# Patient Record
Sex: Female | Born: 2000 | Race: White | Hispanic: No | Marital: Single | Smoking: Never smoker
Health system: Southern US, Community
[De-identification: ages and names within clinical notes are randomized; demographics above are authoritative.]

## PROBLEM LIST (undated history)

## (undated) DIAGNOSIS — G5600 Carpal tunnel syndrome, unspecified upper limb: Secondary | ICD-10-CM

## (undated) DIAGNOSIS — K219 Gastro-esophageal reflux disease without esophagitis: Secondary | ICD-10-CM

## (undated) DIAGNOSIS — L509 Urticaria, unspecified: Secondary | ICD-10-CM

## (undated) DIAGNOSIS — J4520 Mild intermittent asthma, uncomplicated: Secondary | ICD-10-CM

## (undated) DIAGNOSIS — F3281 Premenstrual dysphoric disorder: Secondary | ICD-10-CM

## (undated) DIAGNOSIS — F32A Depression, unspecified: Secondary | ICD-10-CM

## (undated) DIAGNOSIS — F4310 Post-traumatic stress disorder, unspecified: Secondary | ICD-10-CM

## (undated) DIAGNOSIS — F329 Major depressive disorder, single episode, unspecified: Secondary | ICD-10-CM

## (undated) DIAGNOSIS — F909 Attention-deficit hyperactivity disorder, unspecified type: Secondary | ICD-10-CM

## (undated) DIAGNOSIS — F419 Anxiety disorder, unspecified: Secondary | ICD-10-CM

## (undated) HISTORY — PX: UPPER GI ENDOSCOPY: SHX6162

## (undated) HISTORY — DX: Urticaria, unspecified: L50.9

## (undated) HISTORY — DX: Attention-deficit hyperactivity disorder, unspecified type: F90.9

## (undated) HISTORY — DX: Mild intermittent asthma, uncomplicated: J45.20

## (undated) HISTORY — DX: Gastro-esophageal reflux disease without esophagitis: K21.9

## (undated) HISTORY — PX: COLONOSCOPY: SHX174

---
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
## 2019-01-09 ENCOUNTER — Other Ambulatory Visit: Payer: Self-pay | Admitting: Family Medicine

## 2019-01-12 NOTE — Telephone Encounter (Signed)
Message routed to PCP CMA  

## 2019-01-13 NOTE — Telephone Encounter (Signed)
Sent to the pharmacy by e-scribe. 

## 2019-01-28 ENCOUNTER — Ambulatory Visit (INDEPENDENT_AMBULATORY_CARE_PROVIDER_SITE_OTHER): Payer: Self-pay | Admitting: Family Medicine

## 2019-01-28 ENCOUNTER — Encounter: Payer: Self-pay | Admitting: Family Medicine

## 2019-01-28 ENCOUNTER — Other Ambulatory Visit: Payer: Self-pay

## 2019-01-28 VITALS — BP 118/72 | HR 108 | Temp 98.0°F | Wt 207.8 lb

## 2019-01-28 DIAGNOSIS — G43909 Migraine, unspecified, not intractable, without status migrainosus: Secondary | ICD-10-CM | POA: Insufficient documentation

## 2019-01-28 DIAGNOSIS — M546 Pain in thoracic spine: Secondary | ICD-10-CM

## 2019-01-28 DIAGNOSIS — G8929 Other chronic pain: Secondary | ICD-10-CM

## 2019-01-28 DIAGNOSIS — G43009 Migraine without aura, not intractable, without status migrainosus: Secondary | ICD-10-CM

## 2019-01-28 MED ORDER — SUMATRIPTAN SUCCINATE 100 MG PO TABS: 100.0000 mg | ORAL_TABLET | ORAL | 0 refills | Status: DC | PRN

## 2019-01-28 MED ORDER — CYCLOBENZAPRINE HCL 10 MG PO TABS: 10.0000 mg | ORAL_TABLET | Freq: Three times a day (TID) | ORAL | 0 refills | Status: DC | PRN

## 2019-01-28 NOTE — Progress Notes (Signed)
   Subjective:    Patient ID: Robin Hoover, female    DOB: 2000/06/09, 18 y.o.   MRN: [id number]  HPI Here for several issues. First she has migraine headaches, and she describes these as classic throbbing headaches that make her sensitive to light and that make her nauseated. Currently she just takes Tylenol and lies down when she has them. These have become more frequent, occurring about every other day in the last  3 weeks. Her father also has migraines. She also describes frequent tightness and pain in the neck and upper back between the shoulder blades. Heat helps. 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   Review of Systems  Constitutional: Negative.   Respiratory: Negative.   Cardiovascular: Negative.   Musculoskeletal: Positive for back pain.  Neurological: Positive for headaches.       Objective:   Physical Exam Constitutional:      General: She is not in acute distress.    Appearance: Normal appearance.  Neck:     Musculoskeletal: Normal range of motion. No neck rigidity.  Cardiovascular:     Rate and Rhythm: Normal rate and regular rhythm.     Pulses: Normal pulses.     Heart sounds: Normal heart sounds.  Pulmonary:     Effort: Pulmonary effort is normal.     Breath sounds: Normal breath sounds.  Musculoskeletal:     Comments: The neck and upper spine are normal with no tenderness and full ROM   Neurological:     General: No focal deficit present.     Mental Status: She is alert and oriented to person, place, and time.           Assessment & Plan:  For the migraines, she will try Imitrex as needed. For the back spasms, she will try Flexeril as needed. I suggested she try yoga to increase her core strength and to reduce muscular tension.  Alysia Penna, MD

## 2019-02-19 ENCOUNTER — Other Ambulatory Visit: Payer: Self-pay | Admitting: Family Medicine

## 2019-02-19 MED ORDER — CETIRIZINE HCL 10 MG PO TABS: 10.0000 mg | ORAL_TABLET | Freq: Every day | ORAL | 0 refills | Status: DC

## 2019-02-19 NOTE — Telephone Encounter (Signed)
Sent to the pharmacy by e-scribe. 

## 2019-03-07 ENCOUNTER — Other Ambulatory Visit: Payer: Self-pay | Admitting: Family Medicine

## 2019-03-09 NOTE — Telephone Encounter (Signed)
Ok for refill? 

## 2019-03-09 NOTE — Telephone Encounter (Signed)
Message routed to PCP CMA  

## 2019-03-10 NOTE — Telephone Encounter (Signed)
Will need follow up. This is not a medication I will prescribe long term for her

## 2019-04-08 ENCOUNTER — Other Ambulatory Visit: Payer: Self-pay

## 2019-04-08 ENCOUNTER — Encounter: Payer: Self-pay | Admitting: Adult Health

## 2019-04-08 ENCOUNTER — Ambulatory Visit (INDEPENDENT_AMBULATORY_CARE_PROVIDER_SITE_OTHER): Payer: BC Managed Care – PPO | Admitting: Adult Health

## 2019-04-08 VITALS — BP 140/80 | Temp 98.1°F | Wt 217.0 lb

## 2019-04-08 DIAGNOSIS — M25532 Pain in left wrist: Secondary | ICD-10-CM | POA: Diagnosis not present

## 2019-04-08 NOTE — Progress Notes (Signed)
Subjective:    Patient ID: Robin Hoover, female    DOB: 2000/12/14, 18 y.o.   MRN: [id number]  HPI 18 year old female who  has a past medical history of ADHD (attention deficit hyperactivity disorder), Anxiety, Asthma, mild intermittent, Depression, and GERD (gastroesophageal reflux disease) (05/20/2013).  She presents to the office today for for an acute issue.  She reports approximately a week ago she was at the dog park and was bit on the lateral aspect of left wrist by another dog.  There was no broken skin noted.  He continues to have discomfort to the outside of her left wrist.  She has not noticed any bruising, loss of range of motion, or grip strength.   Review of Systems See HPI   Past Medical History:  Diagnosis Date  . ADHD (attention deficit hyperactivity disorder)   . Anxiety   . Asthma, mild intermittent   . Depression   . GERD (gastroesophageal reflux disease) 05/20/2013    Social History   Socioeconomic History  . Marital status: Single    Spouse name: Not on file  . Number of children: Not on file  . Years of education: Not on file  . Highest education level: Not on file  Occupational History  . Not on file  Tobacco Use  . Smoking status: Never Smoker  . Smokeless tobacco: Never Used  Substance and Sexual Activity  . Alcohol use: No  . Drug use: No  . Sexual activity: Never  Other Topics Concern  . Not on file  Social History Narrative   She likes to sleep, watch tv   Social Determinants of Health   Financial Resource Strain:   . Difficulty of Paying Living Expenses: Not on file  Food Insecurity:   . Worried About Charity fundraiser in the Last Year: Not on file  . Ran Out of Food in the Last Year: Not on file  Transportation Needs:   . Lack of Transportation (Medical): Not on file  . Lack of Transportation (Non-Medical): Not on file  Physical Activity:   . Days of Exercise per Week: Not on file  . Minutes of Exercise per Session:  Not on file  Stress:   . Feeling of Stress : Not on file  Social Connections:   . Frequency of Communication with Friends and Family: Not on file  . Frequency of Social Gatherings with Friends and Family: Not on file  . Attends Religious Services: Not on file  . Active Member of Clubs or Organizations: Not on file  . Attends Archivist Meetings: Not on file  . Marital Status: Not on file  Intimate Partner Violence:   . Fear of Current or Ex-Partner: Not on file  . Emotionally Abused: Not on file  . Physically Abused: Not on file  . Sexually Abused: Not on file    Past Surgical History:  Procedure Laterality Date  . COLONOSCOPY      Family History  Problem Relation Age of Onset  . Bipolar disorder Father   . Diabetes Mother   . Bipolar disorder Sister   . Bipolar disorder Paternal Aunt     Allergies  Allergen Reactions  . Pollen Extract Anaphylaxis  . Dust Mite Mixed Allergen Ext [Mite (D. Farinae)]   . Molds & Smuts     Current Outpatient Medications on File Prior to Visit  Medication Sig Dispense Refill  . albuterol (VENTOLIN HFA) 108 (90 Base) MCG/ACT inhaler Inhale 1-2  puffs into the lungs as directed. 6.7 g 2  . buPROPion (WELLBUTRIN XL) 150 MG 24 hr tablet Take 1 tablet by mouth daily.  2  . buPROPion (WELLBUTRIN XL) 300 MG 24 hr tablet TK 1 T PO QAM FOR DEPRESSION    . busPIRone (BUSPAR) 10 MG tablet Take 10 mg by mouth as needed.    . cetirizine (ZYRTEC) 10 MG tablet Take 1 tablet (10 mg total) by mouth daily. 90 tablet 0  . CONCERTA 36 MG CR tablet TK 1 T PO QAM    . cyclobenzaprine (FLEXERIL) 10 MG tablet Take 1 tablet (10 mg total) by mouth 3 (three) times daily as needed for muscle spasms. 90 tablet 0  . LO LOESTRIN FE 1 MG-10 MCG / 10 MCG tablet TAKE 1 TABLET BY MOUTH DAILY 84 tablet 3  . nitrofurantoin, macrocrystal-monohydrate, (MACROBID) 100 MG capsule Take 1 capsule (100 mg total) by mouth 2 (two) times daily. 10 capsule 0  . omeprazole  (PRILOSEC) 40 MG capsule TAKE 1 CAPSULE(40 MG) BY MOUTH DAILY 90 capsule 1  . SUMAtriptan (IMITREX) 100 MG tablet Take 1 tablet (100 mg total) by mouth as needed for migraine. May repeat in 2 hours if headache persists or recurs. 10 tablet 0  . venlafaxine XR (EFFEXOR-XR) 150 MG 24 hr capsule Take 1 capsule by mouth daily.  2   No current facility-administered medications on file prior to visit.    BP 140/80   Temp 98.1 F (36.7 C)   Wt 217 lb (98.4 kg)   BMI 35.56 kg/m       Objective:   Physical Exam Vitals and nursing note reviewed.  Constitutional:      Appearance: Normal appearance.  Musculoskeletal:        General: Tenderness (Mild tenderness noted with palpation to ulnar head) present. No swelling, deformity or signs of injury.     Comments: No bruising, loss of range of motion, or decreased grip strength noted in left hand or wrist  Skin:    General: Skin is warm and dry.     Capillary Refill: Capillary refill takes less than 2 seconds.  Neurological:     General: No focal deficit present.     Mental Status: She is alert and oriented to person, place, and time. Mental status is at baseline.  Psychiatric:        Mood and Affect: Mood normal.        Behavior: Behavior normal.        Thought Content: Thought content normal.        Judgment: Judgment normal.       Assessment & Plan:  1. Acute pain of left wrist -Vies Motrin and ice.  Need for antibiotics as there is no puncture wounds noted.  She is up-to-date on tetanus.  No concern for fracture or dislocation.  Shirline Frees, NP

## 2019-04-30 ENCOUNTER — Other Ambulatory Visit: Payer: Self-pay | Admitting: Adult Health

## 2019-07-10 ENCOUNTER — Other Ambulatory Visit: Payer: Self-pay | Admitting: Adult Health

## 2019-08-20 ENCOUNTER — Other Ambulatory Visit: Payer: Self-pay | Admitting: Family Medicine

## 2019-09-24 ENCOUNTER — Other Ambulatory Visit: Payer: Self-pay

## 2019-09-25 ENCOUNTER — Ambulatory Visit (INDEPENDENT_AMBULATORY_CARE_PROVIDER_SITE_OTHER): Payer: BC Managed Care – PPO | Admitting: Adult Health

## 2019-09-25 ENCOUNTER — Encounter: Payer: Self-pay | Admitting: Adult Health

## 2019-09-25 VITALS — BP 130/70 | Temp 98.1°F | Wt 212.0 lb

## 2019-09-25 DIAGNOSIS — R5383 Other fatigue: Secondary | ICD-10-CM | POA: Diagnosis not present

## 2019-09-25 DIAGNOSIS — E559 Vitamin D deficiency, unspecified: Secondary | ICD-10-CM | POA: Diagnosis not present

## 2019-09-25 DIAGNOSIS — R4189 Other symptoms and signs involving cognitive functions and awareness: Secondary | ICD-10-CM

## 2019-09-25 LAB — CBC WITH DIFFERENTIAL/PLATELET
Basophils Absolute: 0 10*3/uL (ref 0.0–0.1)
Basophils Relative: 0.4 % (ref 0.0–3.0)
Eosinophils Absolute: 0.1 10*3/uL (ref 0.0–0.7)
Eosinophils Relative: 1.3 % (ref 0.0–5.0)
HCT: 39.6 % (ref 36.0–46.0)
Hemoglobin: 13 g/dL (ref 12.0–15.0)
Lymphocytes Relative: 29.3 % (ref 12.0–46.0)
Lymphs Abs: 2.9 10*3/uL (ref 0.7–4.0)
MCHC: 32.9 g/dL (ref 30.0–36.0)
MCV: 77.1 fl — ABNORMAL LOW (ref 78.0–100.0)
Monocytes Absolute: 0.8 10*3/uL (ref 0.1–1.0)
Monocytes Relative: 7.7 % (ref 3.0–12.0)
Neutro Abs: 6 10*3/uL (ref 1.4–7.7)
Neutrophils Relative %: 61.3 % (ref 43.0–77.0)
Platelets: 378 10*3/uL (ref 150.0–400.0)
RBC: 5.13 Mil/uL — ABNORMAL HIGH (ref 3.87–5.11)
RDW: 14.9 % — ABNORMAL HIGH (ref 11.5–14.6)
WBC: 9.8 10*3/uL (ref 4.5–10.5)

## 2019-09-25 LAB — IBC + FERRITIN
Ferritin: 14.5 ng/mL (ref 10.0–291.0)
Iron: 87 ug/dL (ref 42–145)
Saturation Ratios: 21 % (ref 20.0–50.0)
Transferrin: 296 mg/dL (ref 212.0–360.0)

## 2019-09-25 LAB — TSH: TSH: 1.91 u[IU]/mL (ref 0.35–5.50)

## 2019-09-25 LAB — VITAMIN B12: Vitamin B-12: 217 pg/mL (ref 211–911)

## 2019-09-25 LAB — VITAMIN D 25 HYDROXY (VIT D DEFICIENCY, FRACTURES): VITD: 26.8 ng/mL — ABNORMAL LOW (ref 30.00–100.00)

## 2019-09-25 NOTE — Progress Notes (Signed)
Subjective:    Patient ID: Robin Hoover, female    DOB: 2000/11/10, 19 y.o.   MRN: [id number]  HPI  19 year old female who  has a past medical history of ADHD (attention deficit hyperactivity disorder), Anxiety, Asthma, mild intermittent, Depression, and GERD (gastroesophageal reflux disease) (05/20/2013).  She presents to the office today for the complaint of memory loss and sleep disturbance.  Symptoms have been going on for quite a while she feels as though over the last 1 to 2 months been getting worse.  He reports that she always feels tired.  She goes to bed at 9 or 10:00 in the morning and wakes up between 6-8 am to go to work.  She feels tired when she wakes up and throughout the day. No trouble falling asleep or staying asleep.   She reports that she has nearly fallen asleep multiple times while driving.  She does snore but denies apneic episodes.  She does not eat healthy and does not exercise.  Additionally she feels as though her memory is poor.  Per patient report she often forgets simple things such as when she was making this appointment she cannot remember why she was making it.  She does not get lost while driving but will miss a turn and forget where she is driving to.  She is followed by psychiatry but denies any changes in her medications.  She denies fevers, chills, or abdominal pain.   Review of Systems See HPI   Past Medical History:  Diagnosis Date   ADHD (attention deficit hyperactivity disorder)    Anxiety    Asthma, mild intermittent    Depression    GERD (gastroesophageal reflux disease) 05/20/2013    Social History   Socioeconomic History   Marital status: Single    Spouse name: Not on file   Number of children: Not on file   Years of education: Not on file   Highest education level: Not on file  Occupational History   Not on file  Tobacco Use   Smoking status: Never Smoker   Smokeless tobacco: Never Used  Substance and  Sexual Activity   Alcohol use: No   Drug use: No   Sexual activity: Never  Other Topics Concern   Not on file  Social History Narrative   She likes to sleep, watch tv   Social Determinants of Health   Financial Resource Strain:    Difficulty of Paying Living Expenses:   Food Insecurity:    Worried About Programme researcher, broadcasting/film/video in the Last Year:    Barista in the Last Year:   Transportation Needs:    Freight forwarder (Medical):    Lack of Transportation (Non-Medical):   Physical Activity:    Days of Exercise per Week:    Minutes of Exercise per Session:   Stress:    Feeling of Stress :   Social Connections:    Frequency of Communication with Friends and Family:    Frequency of Social Gatherings with Friends and Family:    Attends Religious Services:    Active Member of Clubs or Organizations:    Attends Banker Meetings:    Marital Status:   Intimate Partner Violence:    Fear of Current or Ex-Partner:    Emotionally Abused:    Physically Abused:    Sexually Abused:     Past Surgical History:  Procedure Laterality Date   COLONOSCOPY  Family History  Problem Relation Age of Onset   Bipolar disorder Father    Diabetes Mother    Bipolar disorder Sister    Bipolar disorder Paternal Aunt     Allergies  Allergen Reactions   Pollen Extract Anaphylaxis   Dust Mite Mixed Allergen Ext [Mite (D. Farinae)]    Molds & Smuts     Current Outpatient Medications on File Prior to Visit  Medication Sig Dispense Refill   albuterol (VENTOLIN HFA) 108 (90 Base) MCG/ACT inhaler Inhale 1-2 puffs into the lungs as directed. 6.7 g 2   buPROPion (WELLBUTRIN XL) 300 MG 24 hr tablet TK 1 T PO QAM FOR DEPRESSION     busPIRone (BUSPAR) 10 MG tablet Take 10 mg by mouth as needed.     cetirizine (ZYRTEC) 10 MG tablet TAKE 1 TABLET BY MOUTH EVERY DAY 90 tablet 0   cyclobenzaprine (FLEXERIL) 10 MG tablet Take 1 tablet (10 mg  total) by mouth 3 (three) times daily as needed for muscle spasms. 90 tablet 0   LO LOESTRIN FE 1 MG-10 MCG / 10 MCG tablet TAKE 1 TABLET BY MOUTH DAILY 84 tablet 0   omeprazole (PRILOSEC) 40 MG capsule TAKE 1 CAPSULE(40 MG) BY MOUTH DAILY 90 capsule 0   SUMAtriptan (IMITREX) 100 MG tablet Take 1 tablet (100 mg total) by mouth as needed for migraine. May repeat in 2 hours if headache persists or recurs. 10 tablet 0   venlafaxine XR (EFFEXOR-XR) 150 MG 24 hr capsule Take 1 capsule by mouth daily.  2   No current facility-administered medications on file prior to visit.    BP 130/70    Temp 98.1 F (36.7 C)    Wt 212 lb (96.2 kg)    BMI 34.74 kg/m       Objective:   Physical Exam Vitals and nursing note reviewed.  Constitutional:      Appearance: Normal appearance. She is obese.  Cardiovascular:     Rate and Rhythm: Normal rate and regular rhythm.     Pulses: Normal pulses.     Heart sounds: Normal heart sounds.  Pulmonary:     Effort: Pulmonary effort is normal.     Breath sounds: Normal breath sounds.  Abdominal:     General: Abdomen is flat. Bowel sounds are normal.     Palpations: Abdomen is soft.  Musculoskeletal:        General: Normal range of motion.  Skin:    General: Skin is warm and dry.  Neurological:     General: No focal deficit present.     Mental Status: She is alert and oriented to person, place, and time.  Psychiatric:        Mood and Affect: Mood normal.        Behavior: Behavior normal.        Thought Content: Thought content normal.        Judgment: Judgment normal.       Assessment & Plan:  1. Fatigue, unspecified type We will check labs for common symptoms.  Can consider referral to pulmonary for sleep study.  Believe her fatigue is associated with her cognitive impairment so we may need to do imaging and/or referred to neurology as well.  We will wait for labs to come back and then make decisions - Vitamin B12 - CBC with  Differential/Platelet - TSH - IBC + Ferritin - Vitamin D, 25-hydroxy  2. Cognitive impairment  - Vitamin B12 - CBC with Differential/Platelet -  TSH - IBC + Ferritin - Vitamin D, 25-hydroxy   Shirline Frees, NP

## 2019-09-25 NOTE — Patient Instructions (Signed)
I am going to check some blood work to look for common conditions of your symptoms.   Once the blood work is back then we will call you   We may need to refer you to a neurologist and/or for a sleep study

## 2019-09-29 ENCOUNTER — Other Ambulatory Visit: Payer: Self-pay | Admitting: Family Medicine

## 2019-09-29 ENCOUNTER — Telehealth: Payer: Self-pay | Admitting: Adult Health

## 2019-09-29 DIAGNOSIS — G4710 Hypersomnia, unspecified: Secondary | ICD-10-CM

## 2019-09-29 NOTE — Telephone Encounter (Signed)
Robin Hoover pt called back to get her lab results

## 2019-09-29 NOTE — Telephone Encounter (Signed)
See result note from 09/25/19.

## 2019-10-24 ENCOUNTER — Other Ambulatory Visit: Payer: Self-pay | Admitting: Adult Health

## 2019-10-27 NOTE — Telephone Encounter (Signed)
DENIED.  NEEDS TO BE SCHEDULED FOR CPX.

## 2019-11-03 ENCOUNTER — Other Ambulatory Visit: Payer: Self-pay

## 2019-11-03 ENCOUNTER — Ambulatory Visit (INDEPENDENT_AMBULATORY_CARE_PROVIDER_SITE_OTHER): Payer: BC Managed Care – PPO | Admitting: Adult Health

## 2019-11-03 ENCOUNTER — Encounter: Payer: Self-pay | Admitting: Adult Health

## 2019-11-03 VITALS — BP 114/70 | Temp 98.4°F | Wt 216.0 lb

## 2019-11-03 DIAGNOSIS — W57XXXA Bitten or stung by nonvenomous insect and other nonvenomous arthropods, initial encounter: Secondary | ICD-10-CM | POA: Diagnosis not present

## 2019-11-03 DIAGNOSIS — S30861A Insect bite (nonvenomous) of abdominal wall, initial encounter: Secondary | ICD-10-CM

## 2019-11-03 DIAGNOSIS — G5603 Carpal tunnel syndrome, bilateral upper limbs: Secondary | ICD-10-CM

## 2019-11-03 MED ORDER — PREDNISONE 20 MG PO TABS: 20.0000 mg | ORAL_TABLET | Freq: Every day | ORAL | 0 refills | Status: DC

## 2019-11-03 MED ORDER — CETIRIZINE HCL 10 MG PO TABS: 10.0000 mg | ORAL_TABLET | Freq: Every day | ORAL | 1 refills | Status: DC

## 2019-11-03 NOTE — Progress Notes (Signed)
   Subjective:    Patient ID: Robin Hoover, female    DOB: 10/25/2000, 19 y.o.   MRN: [id number]  HPI  19 year old female who  has a past medical history of ADHD (attention deficit hyperactivity disorder), Anxiety, Asthma, mild intermittent, Depression, and GERD (gastroesophageal reflux disease) (05/20/2013).  She is being evaluated today for 2 separate issues.  First issue is that of a tick bite on her abdomen that occurred 3 days ago.  She reports that the tick was attached for about 14 hours and it was easily removed.  Reports that she has a small area of redness with itching.  Has not not had any other symptoms of tickborne illness.  Second issue has been more chronic for an unknown amount of time.  Reports numbness and tingling in bilateral hands but is worse in her dominant right hand.  Symptoms do not seem to be worse at night or in the early morning.  Has some mild decreased grip strength.  Better with rest and worse wit activities.  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  At home she has been wearing wrist splints intermittently has not found any help with this   Review of Systems     Objective:   Physical Exam Vitals and nursing note reviewed.  Constitutional:      Appearance: Normal appearance.  Cardiovascular:     Rate and Rhythm: Normal rate and regular rhythm.     Pulses: Normal pulses.     Heart sounds: Normal heart sounds.  Pulmonary:     Effort: Pulmonary effort is normal.     Breath sounds: Normal breath sounds.  Musculoskeletal:        General: No swelling or tenderness. Normal range of motion.  Skin:    Findings: Erythema present.     Comments: Quarter sized area of localized erythema noted on lower abdomen.  No bull's-eye sign.  Tick has been removed in its entirety.  Neurological:     General: No focal deficit present.     Mental Status: She is alert and oriented to person, place, and time.     Comments: Negative  Phalen's test but positive Tinel's on the right.  Psychiatric:        Mood and Affect: Mood normal.        Behavior: Behavior normal.        Thought Content: Thought content normal.        Judgment: Judgment normal.       Assessment & Plan:  1. Carpal tunnel syndrome on both sides - Follow up if no improvement after treatment  - predniSONE (DELTASONE) 20 MG tablet; Take 1 tablet (20 mg total) by mouth daily with breakfast.  Dispense: 14 tablet; Refill: 0  2. Insect bite (nonvenomous) of abdominal wall, initial encounter -Tick was attached less than 36 hours.  No signs of tickborne illness.  Advise apical cortisone cream for symptom relief.  Red flags reviewed and follow-up instructions discussed  Shirline Frees, NP

## 2019-11-06 ENCOUNTER — Other Ambulatory Visit: Payer: Self-pay

## 2019-11-06 ENCOUNTER — Encounter (HOSPITAL_COMMUNITY): Payer: Self-pay

## 2019-11-06 ENCOUNTER — Emergency Department (HOSPITAL_COMMUNITY)
Admission: EM | Admit: 2019-11-06 | Discharge: 2019-11-06 | Disposition: A | Discharge status: Home / Self Care | Payer: BC Managed Care – PPO | Attending: Emergency Medicine | Admitting: Emergency Medicine

## 2019-11-06 DIAGNOSIS — Z5321 Procedure and treatment not carried out due to patient leaving prior to being seen by health care provider: Secondary | ICD-10-CM | POA: Diagnosis not present

## 2019-11-06 DIAGNOSIS — R040 Epistaxis: Secondary | ICD-10-CM | POA: Diagnosis present

## 2019-11-06 HISTORY — DX: Carpal tunnel syndrome, unspecified upper limb: G56.00

## 2019-11-06 HISTORY — DX: Post-traumatic stress disorder, unspecified: F43.10

## 2019-11-06 NOTE — ED Triage Notes (Signed)
Patienat c/o nosebleed. Patient is currently holding pressure and currently not bleeding. Patient states it was a lot earlier just prior to coming to the ED.

## 2019-11-06 NOTE — ED Notes (Signed)
Pt stated to triage nurse that since nose stopped bleeding she wanted to go home. Triage nurse instructed pt to return if she desires further evaluation.

## 2019-11-17 ENCOUNTER — Other Ambulatory Visit: Payer: Self-pay

## 2019-11-17 ENCOUNTER — Encounter: Payer: Self-pay | Admitting: Pulmonary Disease

## 2019-11-17 ENCOUNTER — Ambulatory Visit: Payer: BC Managed Care – PPO | Admitting: Pulmonary Disease

## 2019-11-17 VITALS — BP 118/78 | HR 96 | Temp 98.0°F | Ht 66.0 in | Wt 231.0 lb

## 2019-11-17 DIAGNOSIS — G4733 Obstructive sleep apnea (adult) (pediatric): Secondary | ICD-10-CM

## 2019-11-17 NOTE — Patient Instructions (Signed)
Moderate probability of significant obstructive sleep apnea  We will schedule you for home sleep study We will update your results as soon as reviewed  Treatment options as we discussed  I will follow-up with you in about 3 months  Call with significant concerns Sleep Apnea Sleep apnea is a condition in which breathing pauses or becomes shallow during sleep. Episodes of sleep apnea usually last 10 seconds or longer, and they may occur as many as 20 times an hour. Sleep apnea disrupts your sleep and keeps your body from getting the rest that it needs. This condition can increase your risk of certain health problems, including:  Heart attack.  Stroke.  Obesity.  Diabetes.  Heart failure.  Irregular heartbeat. What are the causes? There are three kinds of sleep apnea:  Obstructive sleep apnea. This kind is caused by a blocked or collapsed airway.  Central sleep apnea. This kind happens when the part of the brain that controls breathing does not send the correct signals to the muscles that control breathing.  Mixed sleep apnea. This is a combination of obstructive and central sleep apnea. The most common cause of this condition is a collapsed or blocked airway. An airway can collapse or become blocked if:  Your throat muscles are abnormally relaxed.  Your tongue and tonsils are larger than normal.  You are overweight.  Your airway is smaller than normal. What increases the risk? You are more likely to develop this condition if you:  Are overweight.  Smoke.  Have a smaller than normal airway.  Are elderly.  Are female.  Drink alcohol.  Take sedatives or tranquilizers.  Have a family history of sleep apnea. What are the signs or symptoms? Symptoms of this condition include:  Trouble staying asleep.  Daytime sleepiness and tiredness.  Irritability.  Loud snoring.  Morning headaches.  Trouble concentrating.  Forgetfulness.  Decreased interest in  sex.  Unexplained sleepiness.  Mood swings.  Personality changes.  Feelings of depression.  Waking up often during the night to urinate.  Dry mouth.  Sore throat. How is this diagnosed? This condition may be diagnosed with:  A medical history.  A physical exam.  A series of tests that are done while you are sleeping (sleep study). These tests are usually done in a sleep lab, but they may also be done at home. How is this treated? Treatment for this condition aims to restore normal breathing and to ease symptoms during sleep. It may involve managing health issues that can affect breathing, such as high blood pressure or obesity. Treatment may include:  Sleeping on your side.  Using a decongestant if you have nasal congestion.  Avoiding the use of depressants, including alcohol, sedatives, and narcotics.  Losing weight if you are overweight.  Making changes to your diet.  Quitting smoking.  Using a device to open your airway while you sleep, such as: ? An oral appliance. This is a custom-made mouthpiece that shifts your lower jaw forward. ? A continuous positive airway pressure (CPAP) device. This device blows air through a mask when you breathe out (exhale). ? A nasal expiratory positive airway pressure (EPAP) device. This device has valves that you put into each nostril. ? A bi-level positive airway pressure (BPAP) device. This device blows air through a mask when you breathe in (inhale) and breathe out (exhale).  Having surgery if other treatments do not work. During surgery, excess tissue is removed to create a wider airway. It is important to get  treatment for sleep apnea. Without treatment, this condition can lead to:  High blood pressure.  Coronary artery disease.  In men, an inability to achieve or maintain an erection (impotence).  Reduced thinking abilities. Follow these instructions at home: Lifestyle  Make any lifestyle changes that your health care  provider recommends.  Eat a healthy, well-balanced diet.  Take steps to lose weight if you are overweight.  Avoid using depressants, including alcohol, sedatives, and narcotics.  Do not use any products that contain nicotine or tobacco, such as cigarettes, e-cigarettes, and chewing tobacco. If you need help quitting, ask your health care provider. General instructions  Take over-the-counter and prescription medicines only as told by your health care provider.  If you were given a device to open your airway while you sleep, use it only as told by your health care provider.  If you are having surgery, make sure to tell your health care provider you have sleep apnea. You may need to bring your device with you.  Keep all follow-up visits as told by your health care provider. This is important. Contact a health care provider if:  The device that you received to open your airway during sleep is uncomfortable or does not seem to be working.  Your symptoms do not improve.  Your symptoms get worse. Get help right away if:  You develop: ? Chest pain. ? Shortness of breath. ? Discomfort in your back, arms, or stomach.  You have: ? Trouble speaking. ? Weakness on one side of your body. ? Drooping in your face. These symptoms may represent a serious problem that is an emergency. Do not wait to see if the symptoms will go away. Get medical help right away. Call your local emergency services (911 in the [place] Do not drive yourself to the hospital. Summary  Sleep apnea is a condition in which breathing pauses or becomes shallow during sleep.  The most common cause is a collapsed or blocked airway.  The goal of treatment is to restore normal breathing and to ease symptoms during sleep. This information is not intended to replace advice given to you by your health care provider. Make sure you discuss any questions you have with your health care provider. Document Revised: 09/24/2018  Document Reviewed: 12/03/2017 Elsevier Patient Education  [place].

## 2019-11-17 NOTE — Progress Notes (Signed)
Robin Hoover    [id number]    Apr 06, 2001  Primary Care Physician:Nafziger, Kandee Keen, NP  Referring Physician: Shirline Frees, NP [address] [place],  [place] [address]  Chief complaint:   Patient with a history of snoring, nonrestorative sleep  HPI:  History of snoring, nonrestorative sleep Usually goes to bed between 8 PM and 10 PM About 30 minutes 1 hour to fall asleep About 0-3 awakenings Usual wake up time about 10 AM  Has gained about 10 to 15 pounds History of depression which is better controlled at present with medication changes  Sleep is relatively nonrestorative Denies any significant dryness of her mouth in the mornings No significant headaches in the morning  No caffeinated beverages during the day  Dad has obstructive sleep apnea  Does not play any sports  Never smoker  Outpatient Encounter Medications as of 11/17/2019  Medication Sig  . albuterol (VENTOLIN HFA) 108 (90 Base) MCG/ACT inhaler Inhale 1-2 puffs into the lungs as directed.  . busPIRone (BUSPAR) 10 MG tablet Take 10 mg by mouth as needed.  . cetirizine (ZYRTEC) 10 MG tablet Take 1 tablet (10 mg total) by mouth daily.  Marland Kitchen omeprazole (PRILOSEC) 40 MG capsule TAKE 1 CAPSULE(40 MG) BY MOUTH DAILY  . predniSONE (DELTASONE) 20 MG tablet Take 1 tablet (20 mg total) by mouth daily with breakfast.  . SUMAtriptan (IMITREX) 100 MG tablet Take 1 tablet (100 mg total) by mouth as needed for migraine. May repeat in 2 hours if headache persists or recurs.  . venlafaxine XR (EFFEXOR-XR) 150 MG 24 hr capsule Take 1 capsule by mouth daily.  Marland Kitchen buPROPion (WELLBUTRIN XL) 300 MG 24 hr tablet TK 1 T PO QAM FOR DEPRESSION  . cyclobenzaprine (FLEXERIL) 10 MG tablet Take 1 tablet (10 mg total) by mouth 3 (three) times daily as needed for muscle spasms. (Patient not taking: Reported on 11/17/2019)  . LO LOESTRIN FE 1 MG-10 MCG / 10 MCG tablet TAKE 1 TABLET BY MOUTH DAILY (Patient not taking:  Reported on 11/17/2019)   No facility-administered encounter medications on file as of 11/17/2019.    Allergies as of 11/17/2019 - Review Complete 11/17/2019  Allergen Reaction Noted  . Pollen extract Anaphylaxis 05/20/2013  . Dust mite mixed allergen ext [mite (d. farinae)]  05/27/2017  . Molds & smuts  05/27/2017    Past Medical History:  Diagnosis Date  . ADHD (attention deficit hyperactivity disorder)   . Anxiety   . Asthma, mild intermittent   . Carpal tunnel syndrome   . Depression   . GERD (gastroesophageal reflux disease) 05/20/2013  . PTSD (post-traumatic stress disorder)     Past Surgical History:  Procedure Laterality Date  . COLONOSCOPY    . UPPER GI ENDOSCOPY      Family History  Problem Relation Age of Onset  . Bipolar disorder Father   . Diabetes Mother   . Bipolar disorder Sister   . Bipolar disorder Paternal Aunt     Social History   Socioeconomic History  . Marital status: Single    Spouse name: Not on file  . Number of children: Not on file  . Years of education: Not on file  . Highest education level: Not on file  Occupational History  . Not on file  Tobacco Use  . Smoking status: Never Smoker  . Smokeless tobacco: Never Used  Vaping Use  . Vaping Use: Never used  Substance and Sexual Activity  .  Alcohol use: No  . Drug use: No  . Sexual activity: Never  Other Topics Concern  . Not on file  Social History Narrative   She likes to sleep, watch tv   Social Determinants of Health   Financial Resource Strain:   . Difficulty of Paying Living Expenses:   Food Insecurity:   . Worried About Programme researcher, broadcasting/film/video in the Last Year:   . Barista in the Last Year:   Transportation Needs:   . Freight forwarder (Medical):   Marland Kitchen Lack of Transportation (Non-Medical):   Physical Activity:   . Days of Exercise per Week:   . Minutes of Exercise per Session:   Stress:   . Feeling of Stress :   Social Connections:   . Frequency of  Communication with Friends and Family:   . Frequency of Social Gatherings with Friends and Family:   . Attends Religious Services:   . Active Member of Clubs or Organizations:   . Attends Banker Meetings:   Marland Kitchen Marital Status:   Intimate Partner Violence:   . Fear of Current or Ex-Partner:   . Emotionally Abused:   Marland Kitchen Physically Abused:   . Sexually Abused:     Review of Systems  Constitutional: Positive for fatigue.  Respiratory: Positive for apnea.   Psychiatric/Behavioral: Positive for sleep disturbance.    There were no vitals filed for this visit.   Physical Exam Constitutional:      Appearance: She is obese.  HENT:     Head: Normocephalic.     Mouth/Throat:     Mouth: Mucous membranes are moist.     Comments: Mallampati 3, crowded oropharynx Eyes:     General:        Right eye: No discharge.        Left eye: No discharge.  Cardiovascular:     Rate and Rhythm: Normal rate and regular rhythm.     Pulses: Normal pulses.     Heart sounds: Normal heart sounds. No murmur heard.  No friction rub.  Pulmonary:     Effort: Pulmonary effort is normal. No respiratory distress.     Breath sounds: Normal breath sounds. No stridor. No wheezing or rhonchi.  Musculoskeletal:     Cervical back: No rigidity or tenderness.  Skin:    General: Skin is warm.  Neurological:     General: No focal deficit present.     Mental Status: She is alert.  Psychiatric:        Mood and Affect: Mood normal.    Results of the Epworth flowsheet 11/17/2019  Sitting and reading 1  Watching TV 1  Sitting, inactive in a public place (e.g. a theatre or a meeting) 2  As a passenger in a car for an hour without a break 3  Lying down to rest in the afternoon when circumstances permit 3  Sitting and talking to someone 1  Sitting quietly after a lunch without alcohol 3  In a car, while stopped for a few minutes in traffic 0  Total score 14    Assessment:   Excessive daytime  sleepiness  Nonrestorative sleep  Significant snoring  Moderate probability of significant obstructive sleep apnea  Pathophysiology of sleep disordered breathing discussed with patient  Treatment options for sleep disordered breathing discussed with the patient  Plan/Recommendations: Risk of not treating sleep disordered breathing discussed  We will schedule the patient for home sleep study  Weight loss efforts encouraged  Follow-up in 3 months   Virl Diamond MD [facility] 11/17/2019, 3:04 PM  CC: Shirline Frees, NP

## 2019-11-27 ENCOUNTER — Telehealth: Payer: Self-pay | Admitting: Adult Health

## 2019-11-30 MED ORDER — LO LOESTRIN FE 1 MG-10 MCG / 10 MCG PO TABS: 1.0000 | ORAL_TABLET | Freq: Every day | ORAL | 0 refills | Status: DC

## 2019-11-30 MED ORDER — OMEPRAZOLE 40 MG PO CPDR: DELAYED_RELEASE_CAPSULE | ORAL | 0 refills | Status: DC

## 2019-11-30 NOTE — Addendum Note (Signed)
Addended by: Raj Janus T on: 11/30/2019 01:56 PM   Modules accepted: Orders

## 2019-11-30 NOTE — Telephone Encounter (Signed)
Sent to the pharmacy by e-scribe. 

## 2019-11-30 NOTE — Telephone Encounter (Signed)
Pt is scheduled in September for her CPE. Pt asked for a refill on two medication until she is able to be seen. She needs it sent to a diff [facility] that is listed below  Medication Refill: Omeprazole  Lo Loestrin   Pharmacy: [id number] [place], [place] - [address] AT [facility] [address] Phone:  [phone]  Fax:  [phone]

## 2019-11-30 NOTE — Telephone Encounter (Signed)
Denied.  Due for yearly physical. 

## 2019-12-30 ENCOUNTER — Telehealth: Payer: Self-pay | Admitting: Adult Health

## 2019-12-30 ENCOUNTER — Ambulatory Visit (INDEPENDENT_AMBULATORY_CARE_PROVIDER_SITE_OTHER): Payer: BC Managed Care – PPO | Admitting: Adult Health

## 2019-12-30 ENCOUNTER — Other Ambulatory Visit: Payer: Self-pay

## 2019-12-30 ENCOUNTER — Encounter: Payer: Self-pay | Admitting: Adult Health

## 2019-12-30 VITALS — BP 118/70 | HR 97 | Temp 98.7°F | Ht 65.6 in | Wt 218.6 lb

## 2019-12-30 DIAGNOSIS — F32A Depression, unspecified: Secondary | ICD-10-CM

## 2019-12-30 DIAGNOSIS — Z3009 Encounter for other general counseling and advice on contraception: Secondary | ICD-10-CM

## 2019-12-30 DIAGNOSIS — F902 Attention-deficit hyperactivity disorder, combined type: Secondary | ICD-10-CM

## 2019-12-30 DIAGNOSIS — Z0000 Encounter for general adult medical examination without abnormal findings: Secondary | ICD-10-CM

## 2019-12-30 DIAGNOSIS — E6689 Other obesity not elsewhere classified: Secondary | ICD-10-CM

## 2019-12-30 DIAGNOSIS — R300 Dysuria: Secondary | ICD-10-CM

## 2019-12-30 DIAGNOSIS — K219 Gastro-esophageal reflux disease without esophagitis: Secondary | ICD-10-CM | POA: Diagnosis not present

## 2019-12-30 DIAGNOSIS — G43009 Migraine without aura, not intractable, without status migrainosus: Secondary | ICD-10-CM

## 2019-12-30 DIAGNOSIS — F419 Anxiety disorder, unspecified: Secondary | ICD-10-CM | POA: Diagnosis not present

## 2019-12-30 DIAGNOSIS — E668 Other obesity: Secondary | ICD-10-CM

## 2019-12-30 DIAGNOSIS — F329 Major depressive disorder, single episode, unspecified: Secondary | ICD-10-CM

## 2019-12-30 MED ORDER — NORETHIN-ETH ESTRAD-FE BIPHAS 1 MG-10 MCG / 10 MCG PO TABS: 1.0000 | ORAL_TABLET | Freq: Every day | ORAL | 3 refills | Status: DC

## 2019-12-30 MED ORDER — FLUCONAZOLE 150 MG PO TABS: 150.0000 mg | ORAL_TABLET | Freq: Once | ORAL | 0 refills | Status: AC

## 2019-12-30 NOTE — Progress Notes (Signed)
Subjective:    Patient ID: Robin Hoover, female    DOB: 03-29-2001, 19 y.o.   MRN: [id number]  HPI Patient presents for yearly preventative medicine examination. She is a pleasant 19 year old female who  has a past medical history of ADHD (attention deficit hyperactivity disorder), Anxiety, Asthma, mild intermittent, Carpal tunnel syndrome, Depression, GERD (gastroesophageal reflux disease) (05/20/2013), and PTSD (post-traumatic stress disorder).  ADHD -managed by psychiatry.  Currently prescribed. Currently   Depression/anxiety-Managed  by psychiatry.  Takes Effexor, Buspar, and  Wellbutrin and feels well controlled  GERD - takes prilosec daily   Birth Control -takes low Loestrin Fe daily.   Migraines-takes Imitrex 100 mg as needed  Dysuria -she reports that 4 days ago she went to urgent care due to concern of a UTI.  She had dysuria, frequency, and urgency.  Her urinalysis showed possible infection and she was started on Bactrim.  Her culture came back negative ( I do not have these records) he subsequently stopped Bactrim.  Unfortunately, she continues to have symptoms of dysuria, frequency, no urgency.  She is not concerned for STDs at this time.  She denies vaginal discharge or itching.  All immunizations and health maintenance protocols were reviewed with the patient and needed orders were placed.  Appropriate screening laboratory values were ordered for the patient including screening of hyperlipidemia, renal function and hepatic function.  Medication reconciliation,  past medical history, social history, problem list and allergies were reviewed in detail with the patient  Goals were established with regard to weight loss, exercise, and  diet in compliance with medications. She is packing her lunch and not going out to eat any longer. She is also starting a women's kick boxing class.   Wt Readings from Last 3 Encounters:  12/30/19 218 lb 9.6 oz (99.2 kg)  11/17/19 (!)  231 lb (104.8 kg)  11/03/19 216 lb (98 kg)    Review of Systems  Constitutional: Negative.   HENT: Negative.   Eyes: Negative.   Respiratory: Negative.   Cardiovascular: Negative.   Gastrointestinal: Negative.   Endocrine: Negative.   Genitourinary: Positive for dysuria, frequency and urgency. Negative for vaginal bleeding, vaginal discharge and vaginal pain.  Musculoskeletal: Negative.   Skin: Negative.   Allergic/Immunologic: Negative.   Neurological: Negative.   Hematological: Negative.   Psychiatric/Behavioral: Negative.    Past Medical History:  Diagnosis Date  . ADHD (attention deficit hyperactivity disorder)   . Anxiety   . Asthma, mild intermittent   . Carpal tunnel syndrome   . Depression   . GERD (gastroesophageal reflux disease) 05/20/2013  . PTSD (post-traumatic stress disorder)     Social History   Socioeconomic History  . Marital status: Single    Spouse name: Not on file  . Number of children: Not on file  . Years of education: Not on file  . Highest education level: Not on file  Occupational History  . Not on file  Tobacco Use  . Smoking status: Never Smoker  . Smokeless tobacco: Never Used  Vaping Use  . Vaping Use: Never used  Substance and Sexual Activity  . Alcohol use: No  . Drug use: No  . Sexual activity: Never  Other Topics Concern  . Not on file  Social History Narrative   She likes to sleep, watch tv   Social Determinants of Health   Financial Resource Strain:   . Difficulty of Paying Living Expenses: Not on file  Food Insecurity:   .  Worried About Charity fundraiser in the Last Year: Not on file  . Ran Out of Food in the Last Year: Not on file  Transportation Needs:   . Lack of Transportation (Medical): Not on file  . Lack of Transportation (Non-Medical): Not on file  Physical Activity:   . Days of Exercise per Week: Not on file  . Minutes of Exercise per Session: Not on file  Stress:   . Feeling of Stress : Not on file    Social Connections:   . Frequency of Communication with Friends and Family: Not on file  . Frequency of Social Gatherings with Friends and Family: Not on file  . Attends Religious Services: Not on file  . Active Member of Clubs or Organizations: Not on file  . Attends Archivist Meetings: Not on file  . Marital Status: Not on file  Intimate Partner Violence:   . Fear of Current or Ex-Partner: Not on file  . Emotionally Abused: Not on file  . Physically Abused: Not on file  . Sexually Abused: Not on file    Past Surgical History:  Procedure Laterality Date  . COLONOSCOPY    . UPPER GI ENDOSCOPY      Family History  Problem Relation Age of Onset  . Bipolar disorder Father   . Diabetes Mother   . Bipolar disorder Sister   . Bipolar disorder Paternal Aunt     Allergies  Allergen Reactions  . Pollen Extract Anaphylaxis  . Dust Mite Mixed Allergen Ext [Mite (D. Farinae)]   . Molds & Smuts     Current Outpatient Medications on File Prior to Visit  Medication Sig Dispense Refill  . albuterol (VENTOLIN HFA) 108 (90 Base) MCG/ACT inhaler Inhale 1-2 puffs into the lungs as directed. 6.7 g 2  . buPROPion (WELLBUTRIN XL) 300 MG 24 hr tablet TK 1 T PO QAM FOR DEPRESSION    . busPIRone (BUSPAR) 10 MG tablet Take 10 mg by mouth as needed.    . cetirizine (ZYRTEC) 10 MG tablet Take 1 tablet (10 mg total) by mouth daily. 90 tablet 1  . omeprazole (PRILOSEC) 40 MG capsule TAKE 1 CAPSULE(40 MG) BY MOUTH DAILY 90 capsule 0  . phenazopyridine (PYRIDIUM) 100 MG tablet Take 100-200 mg by mouth 3 (three) times daily.    . sertraline (ZOLOFT) 25 MG tablet Take by mouth.    . SUMAtriptan (IMITREX) 100 MG tablet Take 1 tablet (100 mg total) by mouth as needed for migraine. May repeat in 2 hours if headache persists or recurs. 10 tablet 0  . venlafaxine XR (EFFEXOR-XR) 150 MG 24 hr capsule Take 1 capsule by mouth daily.  2   No current facility-administered medications on file prior to  visit.    BP 118/70   Pulse 97   Temp 98.7 F (37.1 C) (Oral)   Ht 5' 5.6" (1.666 m)   Wt 218 lb 9.6 oz (99.2 kg)   SpO2 98%   BMI 35.71 kg/m       Objective:   Physical Exam Vitals and nursing note reviewed.  Constitutional:      General: She is not in acute distress.    Appearance: Normal appearance. She is well-developed. She is not ill-appearing.  HENT:     Head: Normocephalic and atraumatic.     Right Ear: Tympanic membrane, ear canal and external ear normal. There is no impacted cerumen.     Left Ear: Tympanic membrane, ear canal and external ear  normal. There is no impacted cerumen.     Nose: Nose normal. No congestion or rhinorrhea.     Mouth/Throat:     Mouth: Mucous membranes are moist.     Pharynx: Oropharynx is clear. No oropharyngeal exudate or posterior oropharyngeal erythema.  Eyes:     General:        Right eye: No discharge.        Left eye: No discharge.     Extraocular Movements: Extraocular movements intact.     Conjunctiva/sclera: Conjunctivae normal.     Pupils: Pupils are equal, round, and reactive to light.  Neck:     Thyroid: No thyromegaly.     Vascular: No carotid bruit.     Trachea: No tracheal deviation.  Cardiovascular:     Rate and Rhythm: Normal rate and regular rhythm.     Pulses: Normal pulses.     Heart sounds: Normal heart sounds. No murmur heard.  No friction rub. No gallop.   Pulmonary:     Effort: Pulmonary effort is normal. No respiratory distress.     Breath sounds: Normal breath sounds. No stridor. No wheezing, rhonchi or rales.  Chest:     Chest wall: No tenderness.  Abdominal:     General: Abdomen is flat. Bowel sounds are normal. There is no distension.     Palpations: Abdomen is soft. There is no mass.     Tenderness: There is no abdominal tenderness. There is no right CVA tenderness, left CVA tenderness, guarding or rebound.     Hernia: No hernia is present.  Musculoskeletal:        General: No swelling,  tenderness, deformity or signs of injury. Normal range of motion.     Cervical back: Normal range of motion and neck supple.     Right lower leg: No edema.     Left lower leg: No edema.  Lymphadenopathy:     Cervical: No cervical adenopathy.  Skin:    General: Skin is warm and dry.     Coloration: Skin is not jaundiced or pale.     Findings: No bruising, erythema, lesion or rash.  Neurological:     General: No focal deficit present.     Mental Status: She is alert and oriented to person, place, and time.     Cranial Nerves: No cranial nerve deficit.     Sensory: No sensory deficit.     Motor: No weakness.     Coordination: Coordination normal.     Gait: Gait normal.     Deep Tendon Reflexes: Reflexes normal.  Psychiatric:        Mood and Affect: Mood normal.        Behavior: Behavior normal.        Thought Content: Thought content normal.        Judgment: Judgment normal.       Assessment & Plan:  1. Routine general medical examination at a health care facility  - CBC with Differential/Platelet; Future - Hemoglobin A1c; Future - Lipid panel; Future - TSH; Future - CMP with eGFR([facility]); Future  2. Anxiety and depression -Follow-up with psychiatry as directed  3. ADHD (attention deficit hyperactivity disorder), combined type -Follow-up with psychiatry as directed  4. Gastroesophageal reflux disease, unspecified whether esophagitis present - Continue with Prilosec   5. Birth control counseling  - POCT urine pregnancy; Future - Norethindrone-Ethinyl Estradiol-Fe Biphas (LO LOESTRIN FE) 1 MG-10 MCG / 10 MCG tablet; Take 1 tablet by mouth daily.  Dispense: 84 tablet; Refill: 3  6. Migraine without aura and without status migrainosus, not intractable - Continue with imitrex as needed - CBC with Differential/Platelet; Future - Hemoglobin A1c; Future - Lipid panel; Future - TSH; Future - CMP with eGFR([facility]); Future  7. Other obesity - Congratulated on weight loss.  Continue with lifestyle modifications  - CBC with Differential/Platelet; Future - Hemoglobin A1c; Future - Lipid panel; Future - TSH; Future - CMP with eGFR([facility]); Future  8. Dysuria  - Urinalysis; Future - Culture, Urine; Future - Urine cytology ancillary only - fluconazole (DIFLUCAN) 150 MG tablet; Take 1 tablet (150 mg total) by mouth once for 1 dose.  Dispense: 1 tablet; Refill: 0  Dorothyann Peng, NP

## 2019-12-30 NOTE — Telephone Encounter (Signed)
PAtient called to let Kandee Keen know that there is not a generic for this birth control Norethindrone-Ethinyl Estradiol-Fe Biphas (LO LOESTRIN FE) 1 MG-10 MCG / 10 MCG tablet  She wants to know if he can call her in a different birth control.  Please Advise

## 2019-12-31 NOTE — Telephone Encounter (Signed)
Can you please assist with this since PCP is out until Tuesday

## 2019-12-31 NOTE — Telephone Encounter (Signed)
There is a generic for Loestrin Fe 1/20 and I think that should be OK to substitute.

## 2020-01-01 ENCOUNTER — Other Ambulatory Visit: Payer: Self-pay | Admitting: *Deleted

## 2020-01-01 DIAGNOSIS — Z3009 Encounter for other general counseling and advice on contraception: Secondary | ICD-10-CM

## 2020-01-01 MED ORDER — NORETHIN ACE-ETH ESTRAD-FE 1-20 MG-MCG PO TABS: 1.0000 | ORAL_TABLET | Freq: Every day | ORAL | 3 refills | Status: DC

## 2020-01-01 NOTE — Telephone Encounter (Signed)
Below Rx advise from Dr Caryl Never sent to patient pharmacy

## 2020-01-06 ENCOUNTER — Ambulatory Visit: Payer: BC Managed Care – PPO

## 2020-01-06 ENCOUNTER — Other Ambulatory Visit: Payer: Self-pay

## 2020-01-06 DIAGNOSIS — G4733 Obstructive sleep apnea (adult) (pediatric): Secondary | ICD-10-CM

## 2020-01-12 ENCOUNTER — Telehealth: Payer: Self-pay | Admitting: Pulmonary Disease

## 2020-01-12 DIAGNOSIS — G4733 Obstructive sleep apnea (adult) (pediatric): Secondary | ICD-10-CM

## 2020-01-12 NOTE — Telephone Encounter (Signed)
Patient contacted with results of home sleep study. Patient is willing to start CPAP therapy. DME order placed, follow up recall in. Patient verbalized understanding of results and recommendations from Dr. Wynona Neat.

## 2020-01-12 NOTE — Telephone Encounter (Signed)
Call patient  Sleep study result  Date of study: 01/06/2020  Impression: Mild obstructive sleep apnea No significant oxygen desaturations  Recommendation: I will recommend CPAP therapy for obstructive sleep apnea in the context of excessive daytime sleepiness  Another option of treatment will be an oral device-we will require referring to a dentist to fashion an oral device  Sleep position optimization by encouraging sleeping lateral position, elevating the head of the bed may help very mild sleep apnea and snoring  Follow-up in the office as previously scheduled

## 2020-01-28 ENCOUNTER — Encounter: Payer: Self-pay | Admitting: Adult Health

## 2020-01-28 ENCOUNTER — Telehealth: Payer: Self-pay | Admitting: Adult Health

## 2020-01-28 ENCOUNTER — Telehealth (INDEPENDENT_AMBULATORY_CARE_PROVIDER_SITE_OTHER): Payer: BC Managed Care – PPO | Admitting: Adult Health

## 2020-01-28 DIAGNOSIS — F419 Anxiety disorder, unspecified: Secondary | ICD-10-CM

## 2020-01-28 MED ORDER — BUPROPION HCL ER (XL) 300 MG PO TB24: ORAL_TABLET | ORAL | 0 refills | Status: DC

## 2020-01-28 MED ORDER — VENLAFAXINE HCL ER 150 MG PO CP24: 150.0000 mg | ORAL_CAPSULE | Freq: Every day | ORAL | 0 refills | Status: DC

## 2020-01-28 NOTE — Progress Notes (Signed)
Virtual Visit via Video Note  I connected with Robin Hoover on 01/28/20 at  2:00 PM EDT by a video enabled telemedicine application and verified that I am speaking with the correct person using two identifiers.  Location patient: home Location provider:work or home office Persons participating in the virtual visit: patient, provider  I discussed the limitations of evaluation and management by telemedicine and the availability of in person appointments. The patient expressed understanding and agreed to proceed.   HPI:  -year-old female who is being evaluated today for anxiety. She has been out of Wellbutrin for approximately 1 month and her Effexor for the last 3 days. She is seen by psychiatry but has not been in 10 months so they sent we will no longer fill her medication. Since being out of her Effexor she has started to go through withdrawal symptoms and had "full on panic attack about 2 to 3 hours ago while is at work". She called and schedule an appointment for November 3 with her psychiatrist but they still will not fill the medication until she is seen. Currently feeling better but does feel as though her anxiety is "going up and down".  She denies SI or HI  ROS: See pertinent positives and negatives per HPI.  Past Medical History:  Diagnosis Date  . ADHD (attention deficit hyperactivity disorder)   . Anxiety   . Asthma, mild intermittent   . Carpal tunnel syndrome   . Depression   . GERD (gastroesophageal reflux disease) 05/20/2013  . PTSD (post-traumatic stress disorder)     Past Surgical History:  Procedure Laterality Date  . COLONOSCOPY    . UPPER GI ENDOSCOPY      Family History  Problem Relation Age of Onset  . Bipolar disorder Father   . Diabetes Mother   . Bipolar disorder Sister   . Bipolar disorder Paternal Aunt        Current Outpatient Medications:  .  albuterol (VENTOLIN HFA) 108 (90 Base) MCG/ACT inhaler, Inhale 1-2 puffs into the lungs as  directed., Disp: 6.7 g, Rfl: 2 .  buPROPion (WELLBUTRIN XL) 300 MG 24 hr tablet, TK 1 T PO QAM FOR DEPRESSION, Disp: , Rfl:  .  busPIRone (BUSPAR) 10 MG tablet, Take 10 mg by mouth as needed., Disp: , Rfl:  .  cetirizine (ZYRTEC) 10 MG tablet, Take 1 tablet (10 mg total) by mouth daily., Disp: 90 tablet, Rfl: 1 .  Norethin Ace-Eth Estrad-FE (AUROVELA FE 1/20 PO), Take by mouth., Disp: , Rfl:  .  omeprazole (PRILOSEC) 40 MG capsule, TAKE 1 CAPSULE(40 MG) BY MOUTH DAILY, Disp: 90 capsule, Rfl: 0 .  venlafaxine XR (EFFEXOR-XR) 150 MG 24 hr capsule, Take 1 capsule by mouth daily., Disp: , Rfl: 2 .  sertraline (ZOLOFT) 25 MG tablet, Take by mouth., Disp: , Rfl:  .  SUMAtriptan (IMITREX) 100 MG tablet, Take 1 tablet (100 mg total) by mouth as needed for migraine. May repeat in 2 hours if headache persists or recurs., Disp: 10 tablet, Rfl: 0  EXAM:  VITALS per patient if applicable:  GENERAL: alert, oriented, appears well and in no acute distress  HEENT: atraumatic, conjunttiva clear, no obvious abnormalities on inspection of external nose and ears  NECK: normal movements of the head and neck  LUNGS: on inspection no signs of respiratory distress, breathing rate appears normal, no obvious gross SOB, gasping or wheezing  CV: no obvious cyanosis  MS: moves all visible extremities without noticeable abnormality  PSYCH/NEURO: pleasant  and cooperative, no obvious depression or anxiety, speech and thought processing grossly intact  ASSESSMENT AND PLAN:  Discussed the following assessment and plan:  1. Anxiety -We will fill both of her medications for 30 days. She was advised that this would be the only refill I prescribed for her and she needs to follow-up with psychiatry for further evaluation. - buPROPion (WELLBUTRIN XL) 300 MG 24 hr tablet; TK 1 T PO QAM FOR DEPRESSION  Dispense: 30 tablet; Refill: 0 - venlafaxine XR (EFFEXOR-XR) 150 MG 24 hr capsule; Take 1 capsule (150 mg total) by mouth  daily.  Dispense: 30 capsule; Refill: 0     I discussed the assessment and treatment plan with the patient. The patient was provided an opportunity to ask questions and all were answered. The patient agreed with the plan and demonstrated an understanding of the instructions.   The patient was advised to call back or seek an in-person evaluation if the symptoms worsen or if the condition fails to improve as anticipated.   Shirline Frees, NP

## 2020-01-28 NOTE — Telephone Encounter (Signed)
Pt call and want Robin Hoover to know that her psychiatrist will not fill her medication because she missed her appointments haven't been seen in 10 mos and want Robin Hoover to call her.

## 2020-02-16 ENCOUNTER — Other Ambulatory Visit: Payer: Self-pay | Admitting: Adult Health

## 2020-02-16 DIAGNOSIS — F419 Anxiety disorder, unspecified: Secondary | ICD-10-CM

## 2020-03-02 ENCOUNTER — Other Ambulatory Visit: Payer: Self-pay | Admitting: Adult Health

## 2020-03-10 ENCOUNTER — Encounter: Payer: Self-pay | Admitting: Adult Health

## 2020-03-10 ENCOUNTER — Other Ambulatory Visit: Payer: Self-pay

## 2020-03-10 ENCOUNTER — Ambulatory Visit (INDEPENDENT_AMBULATORY_CARE_PROVIDER_SITE_OTHER): Payer: BC Managed Care – PPO | Admitting: Adult Health

## 2020-03-10 VITALS — BP 120/72 | HR 90 | Temp 98.4°F | Ht 64.0 in | Wt 217.0 lb

## 2020-03-10 DIAGNOSIS — Z23 Encounter for immunization: Secondary | ICD-10-CM

## 2020-03-10 DIAGNOSIS — R251 Tremor, unspecified: Secondary | ICD-10-CM | POA: Diagnosis not present

## 2020-03-10 NOTE — Progress Notes (Signed)
Subjective:    Patient ID: Robin Hoover, female    DOB: [birth year], 19 y.o.   MRN: [id number]  HPI 19 year old female who is being evaluated today for worsening tremors. She reports that she has had tremors to some degree since early childhood. 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 Her tremors are pretty constant bilaterally but sometimes worse in the right and sometimes worse than the left. She is taking all of her medications as directed and is not concerned for withdrawal symptoms. She has also noticed recently that she started to experience jerking in certain extremities from time to time.  Tremors have always affected her writing and drawing  Review of Systems See HPI   Past Medical History:  Diagnosis Date  . ADHD (attention deficit hyperactivity disorder)   . Anxiety   . Asthma, mild intermittent   . Carpal tunnel syndrome   . Depression   . GERD (gastroesophageal reflux disease) 05/20/2013  . PTSD (post-traumatic stress disorder)     Social History   Socioeconomic History  . Marital status: Single    Spouse name: Not on file  . Number of children: Not on file  . Years of education: Not on file  . Highest education level: Not on file  Occupational History  . Not on file  Tobacco Use  . Smoking status: Never Smoker  . Smokeless tobacco: Never Used  Vaping Use  . Vaping Use: Never used  Substance and Sexual Activity  . Alcohol use: No  . Drug use: No  . Sexual activity: Never  Other Topics Concern  . Not on file  Social History Narrative   She likes to sleep, watch tv   Social Determinants of Health   Financial Resource Strain:   . Difficulty of Paying Living Expenses: Not on file  Food Insecurity:   . Worried About Charity fundraiser in the Last Year: Not on file  . Ran Out of Food in the Last Year: Not on file   Transportation Needs:   . Lack of Transportation (Medical): Not on file  . Lack of Transportation (Non-Medical): Not on file  Physical Activity:   . Days of Exercise per Week: Not on file  . Minutes of Exercise per Session: Not on file  Stress:   . Feeling of Stress : Not on file  Social Connections:   . Frequency of Communication with Friends and Family: Not on file  . Frequency of Social Gatherings with Friends and Family: Not on file  . Attends Religious Services: Not on file  . Active Member of Clubs or Organizations: Not on file  . Attends Archivist Meetings: Not on file  . Marital Status: Not on file  Intimate Partner Violence:   . Fear of Current or Ex-Partner: Not on file  . Emotionally Abused: Not on file  . Physically Abused: Not on file  . Sexually Abused: Not on file    Past Surgical History:  Procedure Laterality Date  . COLONOSCOPY    . UPPER GI ENDOSCOPY      Family History  Problem Relation Age of Onset  . Bipolar disorder Father   . Diabetes Mother   . Bipolar disorder Sister   . Bipolar disorder Paternal Aunt     Allergies  Allergen Reactions  . Pollen Extract Anaphylaxis  .  Dust Mite Mixed Allergen Ext [Mite (D. Farinae)]   . Molds & Smuts     Current Outpatient Medications on File Prior to Visit  Medication Sig Dispense Refill  . albuterol (VENTOLIN HFA) 108 (90 Base) MCG/ACT inhaler Inhale 1-2 puffs into the lungs as directed. 6.7 g 2  . buPROPion (WELLBUTRIN XL) 300 MG 24 hr tablet TAKE 1 TABLET BY MOUTH EVERY MORNING FOR DEPRESSION 30 tablet 0  . busPIRone (BUSPAR) 10 MG tablet Take 10 mg by mouth as needed.    . cetirizine (ZYRTEC) 10 MG tablet TAKE 1 TABLET(10 MG) BY MOUTH DAILY 90 tablet 1  . Norethin Ace-Eth Estrad-FE (AUROVELA FE 1/20 PO) Take by mouth.    Marland Kitchen omeprazole (PRILOSEC) 40 MG capsule TAKE 1 CAPSULE(40 MG) BY MOUTH DAILY 90 capsule 0  . propranolol (INDERAL) 20 MG tablet Take 10 mg by mouth 2 (two) times daily.    .  sertraline (ZOLOFT) 25 MG tablet Take by mouth.    . SUMAtriptan (IMITREX) 100 MG tablet Take 1 tablet (100 mg total) by mouth as needed for migraine. May repeat in 2 hours if headache persists or recurs. 10 tablet 0  . venlafaxine XR (EFFEXOR-XR) 150 MG 24 hr capsule Take 1 capsule (150 mg total) by mouth daily. 30 capsule 0   No current facility-administered medications on file prior to visit.    BP 120/72 (BP Location: Left Arm, Patient Position: Sitting, Cuff Size: Large)   Pulse 90   Temp 98.4 F (36.9 C) (Oral)   Ht $R'5\' 4"'sH$  (1.626 m)   Wt 217 lb (98.4 kg)   LMP 02/08/2020 (Approximate)   BMI 37.25 kg/m       Objective:   Physical Exam Vitals and nursing note reviewed.  Constitutional:      Appearance: Normal appearance. She is obese.  Eyes:     Extraocular Movements: Extraocular movements intact.     Pupils: Pupils are equal, round, and reactive to light.     Comments: No KF ring noted    Skin:    General: Skin is warm and dry.     Capillary Refill: Capillary refill takes less than 2 seconds.  Neurological:     General: No focal deficit present.     Mental Status: She is alert and oriented to person, place, and time.     Sensory: No sensory deficit.     Motor: Tremor present. No weakness.     Comments: He has mild to moderate tremor bilaterally that is worse in her right hand.  Psychiatric:        Mood and Affect: Mood normal.        Behavior: Behavior normal.        Thought Content: Thought content normal.        Judgment: Judgment normal.       Assessment & Plan:  1. Tremors of nervous system -Need to rule out Wilson's disease, will start work-up and refer to neurology. I do not think this is medication related as there have not been any medication changes and she has had this for many years. We will hold off of MRI of the brain until blood work returned results - CMP with eGFR([facility]); Future - Ceruloplasmin; Future - TSH; Future - Magnesium; Future - Heavy  Metals Panel, Blood; Future - Direct antiglobulin test (not at [facility]); Future - Ambulatory referral to Neurology  Dorothyann Peng, NP

## 2020-03-14 LAB — COMPLETE METABOLIC PANEL WITH GFR
AG Ratio: 1.2 (calc) (ref 1.0–2.5)
ALT: 19 U/L (ref 6–29)
AST: 24 U/L (ref 10–30)
Albumin: 4.1 g/dL (ref 3.6–5.1)
Alkaline phosphatase (APISO): 91 U/L (ref 31–125)
BUN: 12 mg/dL (ref 7–25)
CO2: 26 mmol/L (ref 20–32)
Calcium: 9.5 mg/dL (ref 8.6–10.2)
Chloride: 103 mmol/L (ref 98–110)
Creat: 0.72 mg/dL (ref 0.50–1.10)
GFR, Est African American: 140 mL/min/{1.73_m2} (ref >=60)
GFR, Est Non African American: 121 mL/min/{1.73_m2} (ref >=60)
Globulin: 3.4 g/dL (calc) (ref 1.9–3.7)
Glucose, Bld: 69 mg/dL (ref 65–99)
Potassium: 4.6 mmol/L (ref 3.5–5.3)
Sodium: 138 mmol/L (ref 135–146)
Total Bilirubin: 0.4 mg/dL (ref 0.2–1.2)
Total Protein: 7.5 g/dL (ref 6.1–8.1)

## 2020-03-14 LAB — TSH: TSH: 2.79 mIU/L

## 2020-03-14 LAB — MAGNESIUM: Magnesium: 1.8 mg/dL (ref 1.5–2.5)

## 2020-03-14 LAB — HEAVY METALS PANEL, BLOOD
Arsenic: <10 mcg/L (ref <23)
Lead: <1 ug/dL (ref <5)
Mercury, B: <5 mcg/L (ref 0–10)

## 2020-03-14 LAB — CERULOPLASMIN: Ceruloplasmin: 51 mg/dL (ref 18–53)

## 2020-03-14 LAB — DIRECT ANTIGLOBULIN TEST (NOT AT ARMC): DAT, Polyspecific: NEGATIVE

## 2020-03-14 NOTE — Progress Notes (Signed)
Assessment/Plan:   1.  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  Patient and I discussed nature and pathophysiology of symptoms.  We discussed that this diagnosis does not mean that one "made up" the symptoms, but rather that the symptoms are derived from stress.  Discussed with the patient that his type of tremor is not generally effectively treated with medication.  Discussed with the patient that treatment involves counseling to help when understand the source of the stress.  Discussed that it could, and generally does, take quite some time for counseling to help resolve the symptoms.  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  She states that she likely just needs to get back with her current counselor.  She is tearful but understanding.  -recommended [email or web address] as a resource to the patient  -I did tell the patient that I cannot rule out that she has a degree of essential tremor and we talked about medication, but I told her that this would not help any tremor coming from functional tremor, which I think is the large majority of the tremor.  She declined trying medication for now.  I think that is a wise decision.  2.  F/u prn    Subjective:   Robin Hoover was seen in consultation in the movement disorder clinic at the request of Shirline Frees, NP.  The evaluation is for tremor.  Tremor started at the age of 19 years old but has become more noticeable.  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  Tremor involves the bilateral UE.  Tremor is present at rest and with activation.  She feels a jerking in the legs but no actual tremor.   There is a family hx of tremor but in mom/sister but they were told that it was PTSD related.  Father also had tremor but they thought that it was med related.  She  had lab work completed, including serum ceruloplasmin and TSH, within the last week and that was all unremarkable.  Affected by caffeine:  Does make tremor worse but she doesn't drink it any longer b/c of it Affected by alcohol:  (drinks 1 time per month so doesn't know) Affected by stress:  Yes.   Affected by fatigue:  No. Spills soup if on spoon:  No. if concentrates hard Affects ADL's (tying shoes, brushing teeth, etc):  No.  Current/Previously tried tremor medications: propranolol 10 mg bid (just put on it by psych for tremor and hasn't noticed any benefit and seemed to make asthma worse so not taking much any longer)  Current medications that may exacerbate tremor:  Albuterol (not using much because hers is expired) - when does use it tremor gets worse  Outside reports reviewed: historical medical records, lab reports, office notes and referral letter/letters.  Allergies  Allergen Reactions  . Pollen Extract Anaphylaxis  . Dust Mite Mixed Allergen Ext [Mite (D. Farinae)]   . Molds & Smuts     Current Outpatient Medications  Medication Instructions  . albuterol (VENTOLIN HFA) 108 (90 Base) MCG/ACT inhaler 1-2 puffs, Inhalation, As directed  . buPROPion (WELLBUTRIN XL) 300 MG 24 hr tablet TAKE 1 TABLET BY MOUTH EVERY MORNING FOR DEPRESSION  . busPIRone (BUSPAR) 10 mg, Oral, As needed  . cetirizine (ZYRTEC) 10 MG tablet TAKE 1 TABLET(10  MG) BY MOUTH DAILY  . Norethin Ace-Eth Estrad-FE (AUROVELA FE 1/20 PO) 1 tablet, Oral, Daily  . omeprazole (PRILOSEC) 40 MG capsule TAKE 1 CAPSULE(40 MG) BY MOUTH DAILY  . propranolol (INDERAL) 10 mg, Oral, 2 times daily  . sertraline (ZOLOFT) 25 mg, Oral, Daily  . SUMAtriptan (IMITREX) 100 mg, Oral, As needed, May repeat in 2 hours if headache persists or recurs.  . venlafaxine XR (EFFEXOR-XR) 150 mg, Oral, Daily     Objective:   VITALS:   Vitals:   03/15/20 0840  BP: 116/73  Pulse: (!) 112  SpO2: 98%  Weight: 215 lb (97.5 kg)  Height:  5\' 6"  (1.676 m)   Gen:  Appears stated age and in NAD. HEENT:  Normocephalic, atraumatic. The mucous membranes are moist. The superficial temporal arteries are without ropiness or tenderness. Cardiovascular: tachy.  regular Lungs: Clear to auscultation bilaterally. Neck: There are no carotid bruits noted bilaterally.  NEUROLOGICAL:  Orientation:  The patient is alert and oriented x 3.   Cranial nerves: There is good facial symmetry. Extraocular muscles are intact and visual fields are full to confrontational testing. Speech is fluent and clear. Soft palate rises symmetrically and there is no tongue deviation. Hearing is intact to conversational tone. Tone: Tone is good throughout. Sensation: Sensation is intact to light touch touch throughout (facial, trunk, extremities). Vibration is intact at the bilateral big toe. There is no extinction with double simultaneous stimulation. There is no sensory dermatomal level identified. Coordination:  The patient has no dysdiadichokinesia or dysmetria. Motor: Strength is 5/5 in the bilateral upper and lower extremities.  Shoulder shrug is equal bilaterally.  There is no pronator drift.  There are no fasciculations noted. DTR's: Deep tendon reflexes are 2/4 at the bilateral biceps, triceps, brachioradialis, patella and achilles.  Plantar responses are downgoing bilaterally. Gait and Station: The patient is able to ambulate without difficulty. The patient is able to heel toe walk without any difficulty. The patient is able to ambulate in a tandem fashion. The patient is able to stand in the Romberg position.   MOVEMENT EXAM: Tremor:  There is no rest tremor.  There is no intention tremor.  Postural tremor in the right knee that has variable amplitude and frequency.  There is entrainment.  She has some trouble with Archimedes spiral's, but minimally so.     Chemistry      Component Value Date/Time   NA 138 03/10/2020 1641   K 4.6 03/10/2020 1641   CL 103  03/10/2020 1641   CO2 26 03/10/2020 1641   BUN 12 03/10/2020 1641   CREATININE 0.72 03/10/2020 1641      Component Value Date/Time   CALCIUM 9.5 03/10/2020 1641   ALKPHOS 97 10/20/2017 1346   AST 24 03/10/2020 1641   ALT 19 03/10/2020 1641   BILITOT 0.4 03/10/2020 1641      Lab Results  Component Value Date   WBC 9.8 09/25/2019   HGB 13.0 09/25/2019   HCT 39.6 09/25/2019   MCV 77.1 (L) 09/25/2019   PLT 378.0 09/25/2019   Lab Results  Component Value Date   TSH 2.79 03/10/2020   Serum ceruloplasmin was normal.   Total time spent on today's visit was 45 minutes, including both face-to-face time and nonface-to-face time.  Time included that spent on review of records (prior notes available to me/labs/imaging if pertinent), discussing treatment and goals, answering patient's questions and coordinating care.  CC:  03/12/2020, NP

## 2020-03-15 ENCOUNTER — Encounter: Payer: Self-pay | Admitting: Neurology

## 2020-03-15 ENCOUNTER — Other Ambulatory Visit: Payer: Self-pay

## 2020-03-15 ENCOUNTER — Ambulatory Visit: Payer: BC Managed Care – PPO | Admitting: Neurology

## 2020-03-15 VITALS — BP 116/73 | HR 112 | Ht 66.0 in | Wt 215.0 lb

## 2020-03-15 DIAGNOSIS — R251 Tremor, unspecified: Secondary | ICD-10-CM

## 2020-03-15 NOTE — Patient Instructions (Signed)
It was good to see you today!  Let us know if you would like referrals to counselors who understand this disease.  You can try and explore the website [email or web address]  The physicians and staff at [facility] are committed to providing excellent care. You may receive a survey requesting feedback about your experience at our office. We strive to receive "very good" responses to the survey questions. If you feel that your experience would prevent you from giving the office a "very good " response, please contact our office to try to remedy the situation. We may be reached at [phone]. Thank you for taking the time out of your busy day to complete the survey.

## 2020-03-17 ENCOUNTER — Other Ambulatory Visit: Payer: Self-pay | Admitting: Adult Health

## 2020-03-17 DIAGNOSIS — F419 Anxiety disorder, unspecified: Secondary | ICD-10-CM

## 2020-03-21 ENCOUNTER — Other Ambulatory Visit: Payer: Self-pay

## 2020-03-21 DIAGNOSIS — F32A Depression, unspecified: Secondary | ICD-10-CM

## 2020-03-21 DIAGNOSIS — F4310 Post-traumatic stress disorder, unspecified: Secondary | ICD-10-CM

## 2020-03-21 DIAGNOSIS — R251 Tremor, unspecified: Secondary | ICD-10-CM

## 2020-04-05 ENCOUNTER — Other Ambulatory Visit: Payer: Self-pay | Admitting: Adult Health

## 2020-04-05 ENCOUNTER — Encounter: Payer: Self-pay | Admitting: Adult Health

## 2020-04-05 DIAGNOSIS — J069 Acute upper respiratory infection, unspecified: Secondary | ICD-10-CM

## 2020-04-05 MED ORDER — ALBUTEROL SULFATE HFA 108 (90 BASE) MCG/ACT IN AERS: 1.0000 | INHALATION_SPRAY | RESPIRATORY_TRACT | 2 refills | Status: DC

## 2020-04-08 ENCOUNTER — Telehealth: Payer: BC Managed Care – PPO | Admitting: Family Medicine

## 2020-04-25 ENCOUNTER — Ambulatory Visit: Payer: BC Managed Care – PPO | Admitting: Psychology

## 2020-04-27 ENCOUNTER — Ambulatory Visit (INDEPENDENT_AMBULATORY_CARE_PROVIDER_SITE_OTHER): Payer: BC Managed Care – PPO | Admitting: Psychology

## 2020-04-27 DIAGNOSIS — F4310 Post-traumatic stress disorder, unspecified: Secondary | ICD-10-CM | POA: Diagnosis not present

## 2020-05-11 ENCOUNTER — Ambulatory Visit (INDEPENDENT_AMBULATORY_CARE_PROVIDER_SITE_OTHER): Payer: BC Managed Care – PPO | Admitting: Psychology

## 2020-05-11 DIAGNOSIS — F330 Major depressive disorder, recurrent, mild: Secondary | ICD-10-CM | POA: Diagnosis not present

## 2020-05-11 DIAGNOSIS — F4310 Post-traumatic stress disorder, unspecified: Secondary | ICD-10-CM | POA: Diagnosis not present

## 2020-05-15 ENCOUNTER — Encounter: Payer: Self-pay | Admitting: Adult Health

## 2020-05-16 ENCOUNTER — Telehealth (INDEPENDENT_AMBULATORY_CARE_PROVIDER_SITE_OTHER): Payer: BC Managed Care – PPO | Admitting: Family Medicine

## 2020-05-16 ENCOUNTER — Encounter: Payer: Self-pay | Admitting: Family Medicine

## 2020-05-16 VITALS — Wt 215.0 lb

## 2020-05-16 DIAGNOSIS — U071 COVID-19: Secondary | ICD-10-CM | POA: Diagnosis not present

## 2020-05-16 MED ORDER — FLUTICASONE PROPIONATE 50 MCG/ACT NA SUSP: 1.0000 | Freq: Every day | NASAL | 0 refills | Status: DC

## 2020-05-16 MED ORDER — BENZONATATE 100 MG PO CAPS: 100.0000 mg | ORAL_CAPSULE | Freq: Two times a day (BID) | ORAL | 0 refills | Status: DC | PRN

## 2020-05-16 NOTE — Progress Notes (Signed)
Virtual Visit via Video Note  I connected with Robin Hoover on 05/16/20 at  2:00 PM EST by a video enabled telemedicine application 2/2 COVID-19 pandemic and verified that I am speaking with the correct person using two identifiers.  Location patient: home Location provider:work or home office Persons participating in the virtual visit: patient, provider  I discussed the limitations of evaluation and management by telemedicine and the availability of in person appointments. The patient expressed understanding and agreed to proceed.   HPI: Pt is a 20 yo female with pmh sig for asthma, migraines, GERD carpal tunnel, PTSD, MDD, GAD, ADHD who is followed by Duane Lope, NP and seen today for acute concern.  Pt tested positive for COVID yesterday, 05/15/20.  Symptoms started Friday 05/13/20 with a HA.  Pt endorses sore throat, cough, fatigue, congestion, loss of taste, ear pain and pressure.  States breathing is ok, has not had to use her albuterol inhaler.  Pt denies vomiting.  Tried dayquil, nyquil, and ibuprofen for symptoms.  Pt notes several co-workers have been out with Covid.  Pt works at [employer] in [employer].  She notes some customers refuse to wear a mask.  Pt had 2 doses of pfizer COVID vaccine.  ROS: See pertinent positives and negatives per HPI.  Past Medical History:  Diagnosis Date  . ADHD (attention deficit hyperactivity disorder)   . Anxiety   . Asthma, mild intermittent   . Carpal tunnel syndrome   . Depression   . GERD (gastroesophageal reflux disease) 05/20/2013  . PTSD (post-traumatic stress disorder)     Past Surgical History:  Procedure Laterality Date  . COLONOSCOPY    . UPPER GI ENDOSCOPY      Family History  Problem Relation Age of Onset  . Bipolar disorder Father   . Narcolepsy Father   . Diabetes Mother   . Bipolar disorder Sister   . Narcolepsy Sister   . Bipolar disorder Paternal Aunt      Current Outpatient Medications:  .   albuterol (VENTOLIN HFA) 108 (90 Base) MCG/ACT inhaler, Inhale 1-2 puffs into the lungs as directed., Disp: 6.7 g, Rfl: 2 .  buPROPion (WELLBUTRIN XL) 300 MG 24 hr tablet, TAKE 1 TABLET BY MOUTH EVERY MORNING FOR DEPRESSION, Disp: 30 tablet, Rfl: 0 .  busPIRone (BUSPAR) 10 MG tablet, Take 10 mg by mouth as needed., Disp: , Rfl:  .  cetirizine (ZYRTEC) 10 MG tablet, TAKE 1 TABLET(10 MG) BY MOUTH DAILY, Disp: 90 tablet, Rfl: 1 .  Norethin Ace-Eth Estrad-FE (AUROVELA FE 1/20 PO), Take 1 tablet by mouth daily. , Disp: , Rfl:  .  omeprazole (PRILOSEC) 40 MG capsule, TAKE 1 CAPSULE(40 MG) BY MOUTH DAILY, Disp: 90 capsule, Rfl: 0 .  propranolol (INDERAL) 20 MG tablet, Take 10 mg by mouth 2 (two) times daily., Disp: , Rfl:  .  sertraline (ZOLOFT) 25 MG tablet, Take 25 mg by mouth daily. , Disp: , Rfl:  .  SUMAtriptan (IMITREX) 100 MG tablet, Take 1 tablet (100 mg total) by mouth as needed for migraine. May repeat in 2 hours if headache persists or recurs., Disp: 10 tablet, Rfl: 0 .  venlafaxine XR (EFFEXOR-XR) 150 MG 24 hr capsule, Take 1 capsule (150 mg total) by mouth daily., Disp: 30 capsule, Rfl: 0  EXAM:  VITALS per patient if applicable: RR between 12-20 bpm  GENERAL: alert, oriented, appears well and in no acute distress  HEENT: atraumatic, conjunctiva clear, no obvious abnormalities on inspection of  external nose and ears  NECK: normal movements of the head and neck  LUNGS: on inspection no signs of respiratory distress, breathing rate appears normal, no obvious gross SOB, gasping or wheezing  CV: no obvious cyanosis  MS: moves all visible extremities without noticeable abnormality  PSYCH/NEURO: pleasant and cooperative, no obvious depression or anxiety, speech and thought processing grossly intact  ASSESSMENT AND PLAN:  Discussed the following assessment and plan:  COVID-19 virus infection  -tested positive on 05/15/20 -Discussed supportive care -Continue self  quarantine -Discussed return to work condition -Given strict precautions for worsening symptoms or shortness of breath - Plan: fluticasone (FLONASE) 50 MCG/ACT nasal spray, benzonatate (TESSALON) 100 MG capsule  Follow-up as needed  I discussed the assessment and treatment plan with the patient. The patient was provided an opportunity to ask questions and all were answered. The patient agreed with the plan and demonstrated an understanding of the instructions.   The patient was advised to call back or seek an in-person evaluation if the symptoms worsen or if the condition fails to improve as anticipated.  [name], MD

## 2020-05-16 NOTE — Telephone Encounter (Signed)
Spoke with the pt and scheduled an appt for today with Dr Salomon Fick as PCP is out of the office.

## 2020-05-17 ENCOUNTER — Encounter: Payer: Self-pay | Admitting: Adult Health

## 2020-05-18 ENCOUNTER — Other Ambulatory Visit: Payer: Self-pay | Admitting: Adult Health

## 2020-05-18 DIAGNOSIS — J069 Acute upper respiratory infection, unspecified: Secondary | ICD-10-CM

## 2020-05-19 ENCOUNTER — Ambulatory Visit (INDEPENDENT_AMBULATORY_CARE_PROVIDER_SITE_OTHER): Payer: BC Managed Care – PPO | Admitting: Psychology

## 2020-05-19 ENCOUNTER — Ambulatory Visit: Payer: BC Managed Care – PPO | Admitting: Psychology

## 2020-05-19 DIAGNOSIS — F331 Major depressive disorder, recurrent, moderate: Secondary | ICD-10-CM

## 2020-05-19 DIAGNOSIS — F4310 Post-traumatic stress disorder, unspecified: Secondary | ICD-10-CM | POA: Diagnosis not present

## 2020-05-31 ENCOUNTER — Ambulatory Visit (INDEPENDENT_AMBULATORY_CARE_PROVIDER_SITE_OTHER): Payer: BC Managed Care – PPO | Admitting: Psychology

## 2020-05-31 DIAGNOSIS — F330 Major depressive disorder, recurrent, mild: Secondary | ICD-10-CM

## 2020-05-31 DIAGNOSIS — F4310 Post-traumatic stress disorder, unspecified: Secondary | ICD-10-CM

## 2020-06-02 ENCOUNTER — Other Ambulatory Visit: Payer: Self-pay | Admitting: Adult Health

## 2020-06-02 NOTE — Telephone Encounter (Signed)
Sent to the pharmacy by e-scribe. 

## 2020-06-15 ENCOUNTER — Ambulatory Visit (INDEPENDENT_AMBULATORY_CARE_PROVIDER_SITE_OTHER): Payer: BC Managed Care – PPO | Admitting: Psychology

## 2020-06-15 DIAGNOSIS — F330 Major depressive disorder, recurrent, mild: Secondary | ICD-10-CM

## 2020-06-15 DIAGNOSIS — F4310 Post-traumatic stress disorder, unspecified: Secondary | ICD-10-CM | POA: Diagnosis not present

## 2020-06-28 ENCOUNTER — Ambulatory Visit (INDEPENDENT_AMBULATORY_CARE_PROVIDER_SITE_OTHER): Payer: BC Managed Care – PPO | Admitting: Psychology

## 2020-06-28 DIAGNOSIS — F4310 Post-traumatic stress disorder, unspecified: Secondary | ICD-10-CM

## 2020-07-05 ENCOUNTER — Ambulatory Visit (INDEPENDENT_AMBULATORY_CARE_PROVIDER_SITE_OTHER): Payer: BC Managed Care – PPO | Admitting: Psychology

## 2020-07-05 DIAGNOSIS — F4310 Post-traumatic stress disorder, unspecified: Secondary | ICD-10-CM

## 2020-07-12 ENCOUNTER — Ambulatory Visit (INDEPENDENT_AMBULATORY_CARE_PROVIDER_SITE_OTHER): Payer: BC Managed Care – PPO | Admitting: Psychology

## 2020-07-12 DIAGNOSIS — F4310 Post-traumatic stress disorder, unspecified: Secondary | ICD-10-CM

## 2020-07-19 ENCOUNTER — Ambulatory Visit (INDEPENDENT_AMBULATORY_CARE_PROVIDER_SITE_OTHER): Payer: BC Managed Care – PPO | Admitting: Psychology

## 2020-07-19 DIAGNOSIS — F4310 Post-traumatic stress disorder, unspecified: Secondary | ICD-10-CM | POA: Diagnosis not present

## 2020-07-24 ENCOUNTER — Encounter: Payer: Self-pay | Admitting: Adult Health

## 2020-07-26 ENCOUNTER — Ambulatory Visit (INDEPENDENT_AMBULATORY_CARE_PROVIDER_SITE_OTHER): Payer: BC Managed Care – PPO | Admitting: Psychology

## 2020-07-26 DIAGNOSIS — F4310 Post-traumatic stress disorder, unspecified: Secondary | ICD-10-CM

## 2020-08-01 ENCOUNTER — Other Ambulatory Visit: Payer: Self-pay

## 2020-08-02 ENCOUNTER — Ambulatory Visit (INDEPENDENT_AMBULATORY_CARE_PROVIDER_SITE_OTHER): Payer: BC Managed Care – PPO | Admitting: Adult Health

## 2020-08-02 ENCOUNTER — Encounter: Payer: Self-pay | Admitting: Adult Health

## 2020-08-02 ENCOUNTER — Ambulatory Visit (INDEPENDENT_AMBULATORY_CARE_PROVIDER_SITE_OTHER): Payer: BC Managed Care – PPO | Admitting: Psychology

## 2020-08-02 VITALS — BP 138/76 | HR 88 | Temp 98.5°F | Wt 216.0 lb

## 2020-08-02 DIAGNOSIS — L729 Follicular cyst of the skin and subcutaneous tissue, unspecified: Secondary | ICD-10-CM

## 2020-08-02 DIAGNOSIS — F4310 Post-traumatic stress disorder, unspecified: Secondary | ICD-10-CM | POA: Diagnosis not present

## 2020-08-02 NOTE — Progress Notes (Signed)
Subjective:    Patient ID: Robin Hoover, female    DOB: [birth year], 20 y.o.   MRN: [id number]  HPI 20 year old female who  has a past medical history of ADHD (attention deficit hyperactivity disorder), Anxiety, Asthma, mild intermittent, Carpal tunnel syndrome, Depression, GERD (gastroesophageal reflux disease) (05/20/2013), and PTSD (post-traumatic stress disorder).   She presents to the office today for an acute issue.  She reports that about a week ago she noticed a lump on her left upper arm.  When she first noticed that it was larger and was painful to palpation..  Over the last few days this "lump "has decreased in size and is no longer hurting.  She denies redness, warmth, or drainage.  Has no loss of sensation in her left hand.    Review of Systems See HPI   Past Medical History:  Diagnosis Date  . ADHD (attention deficit hyperactivity disorder)   . Anxiety   . Asthma, mild intermittent   . Carpal tunnel syndrome   . Depression   . GERD (gastroesophageal reflux disease) 05/20/2013  . PTSD (post-traumatic stress disorder)     Social History   Socioeconomic History  . Marital status: Single    Spouse name: Not on file  . Number of children: Not on file  . Years of education: Not on file  . Highest education level: Not on file  Occupational History  . Not on file  Tobacco Use  . Smoking status: Never Smoker  . Smokeless tobacco: Never Used  Vaping Use  . Vaping Use: Never used  Substance and Sexual Activity  . Alcohol use: Yes    Comment: rarely  . Drug use: No  . Sexual activity: Never  Other Topics Concern  . Not on file  Social History Narrative   She likes to sleep, watch tv   Social Determinants of Health   Financial Resource Strain: Not on file  Food Insecurity: Not on file  Transportation Needs: Not on file  Physical Activity: Not on file  Stress: Not on file  Social Connections: Not on file  Intimate Partner Violence: Not on file     Past Surgical History:  Procedure Laterality Date  . COLONOSCOPY    . UPPER GI ENDOSCOPY      Family History  Problem Relation Age of Onset  . Bipolar disorder Father   . Narcolepsy Father   . Diabetes Mother   . Bipolar disorder Sister   . Narcolepsy Sister   . Bipolar disorder Paternal Aunt     Allergies  Allergen Reactions  . Pollen Extract Anaphylaxis  . Dust Mite Mixed Allergen Ext [Mite (D. Farinae)]   . Molds & Smuts     Current Outpatient Medications on File Prior to Visit  Medication Sig Dispense Refill  . albuterol (VENTOLIN HFA) 108 (90 Base) MCG/ACT inhaler Inhale 1-2 puffs into the lungs as directed. 6.7 g 2  . buPROPion (WELLBUTRIN XL) 300 MG 24 hr tablet TAKE 1 TABLET BY MOUTH EVERY MORNING FOR DEPRESSION 30 tablet 0  . busPIRone (BUSPAR) 10 MG tablet Take 10 mg by mouth as needed.    . cetirizine (ZYRTEC) 10 MG tablet TAKE 1 TABLET(10 MG) BY MOUTH DAILY 90 tablet 1  . Norethin Ace-Eth Estrad-FE (AUROVELA FE 1/20 PO) Take 1 tablet by mouth daily.     Marland Kitchen omeprazole (PRILOSEC) 40 MG capsule TAKE 1 CAPSULE(40 MG) BY MOUTH DAILY 90 capsule 1  . venlafaxine XR (EFFEXOR-XR) 150 MG 24  hr capsule Take 1 capsule (150 mg total) by mouth daily. 30 capsule 0  . benzonatate (TESSALON) 100 MG capsule Take 1 capsule (100 mg total) by mouth 2 (two) times daily as needed for cough. (Patient not taking: Reported on 08/02/2020) 20 capsule 0  . fluticasone (FLONASE) 50 MCG/ACT nasal spray Place 1 spray into both nostrils daily. (Patient not taking: Reported on 08/02/2020) 16 g 0  . propranolol (INDERAL) 20 MG tablet Take 10 mg by mouth 2 (two) times daily. (Patient not taking: Reported on 08/02/2020)    . sertraline (ZOLOFT) 25 MG tablet Take 25 mg by mouth daily.  (Patient not taking: Reported on 08/02/2020)    . SUMAtriptan (IMITREX) 100 MG tablet Take 1 tablet (100 mg total) by mouth as needed for migraine. May repeat in 2 hours if headache persists or recurs. (Patient not taking:  Reported on 08/02/2020) 10 tablet 0   No current facility-administered medications on file prior to visit.    BP 138/76 (BP Location: Right Arm, Patient Position: Sitting, Cuff Size: Normal)   Pulse 88   Temp 98.5 F (36.9 C) (Oral)   Wt 216 lb (98 kg)   SpO2 98%   BMI 34.86 kg/m       Objective:   Physical Exam Vitals and nursing note reviewed.  Constitutional:      Appearance: Normal appearance.  Cardiovascular:     Pulses: Normal pulses.          Radial pulses are 2+ on the right side and 2+ on the left side.  Skin:    General: Skin is warm and dry.     Capillary Refill: Capillary refill takes less than 2 seconds.     Comments: Has about a 1 cm cystic mass in her left upper arm.  Neurological:     General: No focal deficit present.     Mental Status: She is alert and oriented to person, place, and time.  Psychiatric:        Mood and Affect: Mood normal.        Behavior: Behavior normal.        Thought Content: Thought content normal.        Judgment: Judgment normal.       Assessment & Plan:  1. Cyst of skin -No concern of DVT or thrombophlebitis.  Area in question feels like a cyst.  Continue to monitor.  Red flags reviewed and return precautions discussed  Shirline Frees, NP

## 2020-08-16 ENCOUNTER — Ambulatory Visit: Payer: BC Managed Care – PPO | Admitting: Psychology

## 2020-08-22 ENCOUNTER — Ambulatory Visit: Payer: BC Managed Care – PPO | Admitting: Psychology

## 2020-08-25 ENCOUNTER — Encounter: Payer: Self-pay | Admitting: Adult Health

## 2020-08-25 DIAGNOSIS — Z3009 Encounter for other general counseling and advice on contraception: Secondary | ICD-10-CM

## 2020-08-30 ENCOUNTER — Ambulatory Visit: Payer: BC Managed Care – PPO | Admitting: Psychology

## 2020-09-06 ENCOUNTER — Ambulatory Visit: Payer: BC Managed Care – PPO | Admitting: Psychology

## 2020-10-10 ENCOUNTER — Other Ambulatory Visit: Payer: Self-pay

## 2020-10-10 ENCOUNTER — Other Ambulatory Visit (HOSPITAL_COMMUNITY)
Admission: RE | Admit: 2020-10-10 | Discharge: 2020-10-10 | Disposition: A | Discharge status: Home / Self Care | Payer: BC Managed Care – PPO | Source: Ambulatory Visit | Attending: Obstetrics and Gynecology | Admitting: Obstetrics and Gynecology

## 2020-10-10 ENCOUNTER — Encounter: Payer: Self-pay | Admitting: Obstetrics and Gynecology

## 2020-10-10 ENCOUNTER — Ambulatory Visit: Payer: BC Managed Care – PPO | Admitting: Obstetrics and Gynecology

## 2020-10-10 DIAGNOSIS — N938 Other specified abnormal uterine and vaginal bleeding: Secondary | ICD-10-CM | POA: Insufficient documentation

## 2020-10-10 DIAGNOSIS — Z01419 Encounter for gynecological examination (general) (routine) without abnormal findings: Secondary | ICD-10-CM | POA: Diagnosis present

## 2020-10-10 DIAGNOSIS — Z113 Encounter for screening for infections with a predominantly sexual mode of transmission: Secondary | ICD-10-CM | POA: Insufficient documentation

## 2020-10-10 MED ORDER — LO LOESTRIN FE 1 MG-10 MCG / 10 MCG PO TABS: 1.0000 | ORAL_TABLET | Freq: Every day | ORAL | 11 refills | Status: DC

## 2020-10-10 NOTE — Progress Notes (Signed)
Took last pills of Lo Loestrin in March  Started taking Microgestin In April  and May and stopped taking due to having irregular cycles.  LMP: 09/13/2020 Pt states has last intercourse in April and was painful.   States pharmacy changed Rx to [facility] due to insurance coverage.

## 2020-10-10 NOTE — Patient Instructions (Signed)
Health Maintenance, Female Adopting a healthy lifestyle and getting preventive care are important in promoting health and wellness. Ask your health care provider about: The right schedule for you to have regular tests and exams. Things you can do on your own to prevent diseases and keep yourself healthy. What should I know about diet, weight, and exercise? Eat a healthy diet  Eat a diet that includes plenty of vegetables, fruits, low-fat dairy products, and lean protein. Do not eat a lot of foods that are high in solid fats, added sugars, or sodium.  Maintain a healthy weight Body mass index (BMI) is used to identify weight problems. It estimates body fat based on height and weight. Your health care provider can help determineyour BMI and help you achieve or maintain a healthy weight. Get regular exercise Get regular exercise. This is one of the most important things you can do for your health. Most adults should: Exercise for at least 150 minutes each week. The exercise should increase your heart rate and make you sweat (moderate-intensity exercise). Do strengthening exercises at least twice a week. This is in addition to the moderate-intensity exercise. Spend less time sitting. Even light physical activity can be beneficial. Watch cholesterol and blood lipids Have your blood tested for lipids and cholesterol at 20 years of age, then havethis test every 5 years. Have your cholesterol levels checked more often if: Your lipid or cholesterol levels are high. You are older than 21 years of age. You are at high risk for heart disease. What should I know about cancer screening? Depending on your health history and family history, you may need to have cancer screening at various ages. This may include screening for: Breast cancer. Cervical cancer. Colorectal cancer. Skin cancer. Lung cancer. What should I know about heart disease, diabetes, and high blood pressure? Blood pressure and heart  disease High blood pressure causes heart disease and increases the risk of stroke. This is more likely to develop in people who have high blood pressure readings, are of African descent, or are overweight. Have your blood pressure checked: Every 3-5 years if you are 18-39 years of age. Every year if you are 40 years old or older. Diabetes Have regular diabetes screenings. This checks your fasting blood sugar level. Have the screening done: Once every three years after age 40 if you are at a normal weight and have a low risk for diabetes. More often and at a younger age if you are overweight or have a high risk for diabetes. What should I know about preventing infection? Hepatitis B If you have a higher risk for hepatitis B, you should be screened for this virus. Talk with your health care provider to find out if you are at risk forhepatitis B infection. Hepatitis C Testing is recommended for: Everyone born from 1945 through 1965. Anyone with known risk factors for hepatitis C. Sexually transmitted infections (STIs) Get screened for STIs, including gonorrhea and chlamydia, if: You are sexually active and are younger than 21 years of age. You are older than 21 years of age and your health care provider tells you that you are at risk for this type of infection. Your sexual activity has changed since you were last screened, and you are at increased risk for chlamydia or gonorrhea. Ask your health care provider if you are at risk. Ask your health care provider about whether you are at high risk for HIV. Your health care provider may recommend a prescription medicine to help   prevent HIV infection. If you choose to take medicine to prevent HIV, you should first get tested for HIV. You should then be tested every 3 months for as long as you are taking the medicine. Pregnancy If you are about to stop having your period (premenopausal) and you may become pregnant, seek counseling before you get  pregnant. Take 400 to 800 micrograms (mcg) of folic acid every day if you become pregnant. Ask for birth control (contraception) if you want to prevent pregnancy. Osteoporosis and menopause Osteoporosis is a disease in which the bones lose minerals and strength with aging. This can result in bone fractures. If you are 65 years old or older, or if you are at risk for osteoporosis and fractures, ask your health care provider if you should: Be screened for bone loss. Take a calcium or vitamin D supplement to lower your risk of fractures. Be given hormone replacement therapy (HRT) to treat symptoms of menopause. Follow these instructions at home: Lifestyle Do not use any products that contain nicotine or tobacco, such as cigarettes, e-cigarettes, and chewing tobacco. If you need help quitting, ask your health care provider. Do not use street drugs. Do not share needles. Ask your health care provider for help if you need support or information about quitting drugs. Alcohol use Do not drink alcohol if: Your health care provider tells you not to drink. You are pregnant, may be pregnant, or are planning to become pregnant. If you drink alcohol: Limit how much you use to 0-1 drink a day. Limit intake if you are breastfeeding. Be aware of how much alcohol is in your drink. In the [place] one drink equals one 12 oz bottle of beer (355 mL), one 5 oz glass of wine (148 mL), or one 1 oz glass of hard liquor (44 mL). General instructions Schedule regular health, dental, and eye exams. Stay current with your vaccines. Tell your health care provider if: You often feel depressed. You have ever been abused or do not feel safe at home. Summary Adopting a healthy lifestyle and getting preventive care are important in promoting health and wellness. Follow your health care provider's instructions about healthy diet, exercising, and getting tested or screened for diseases. Follow your health care provider's  instructions on monitoring your cholesterol and blood pressure. This information is not intended to replace advice given to you by your health care provider. Make sure you discuss any questions you have with your healthcare provider. Document Revised: 04/02/2018 Document Reviewed: 04/02/2018 Elsevier Patient Education  2022 Elsevier Inc.  

## 2020-10-11 LAB — CYTOLOGY - PAP
Chlamydia: NEGATIVE
Comment: NEGATIVE
Comment: NORMAL
Diagnosis: NEGATIVE
Neisseria Gonorrhea: NEGATIVE

## 2020-10-11 LAB — CERVICOVAGINAL ANCILLARY ONLY
Bacterial Vaginitis (gardnerella): POSITIVE — AB
Candida Glabrata: NEGATIVE
Candida Vaginitis: NEGATIVE
Chlamydia: NEGATIVE
Comment: NEGATIVE
Comment: NEGATIVE
Comment: NEGATIVE
Comment: NEGATIVE
Comment: NEGATIVE
Comment: NORMAL
Neisseria Gonorrhea: NEGATIVE
Trichomonas: NEGATIVE

## 2020-10-11 NOTE — Progress Notes (Signed)
Robin Hoover is a 20 y.o. No obstetric history on file. female here for a routine annual gynecologic exam.  Current complaints: Irregular bleeding since insurance switched OCP's. Desires STD testing. Had some pain with IC 1 month ago.      Gynecologic History No LMP recorded. Contraception: OCP (estrogen/progesterone) Last Pap: NA.  Last mammogram: NA.   Obstetric History OB History  No obstetric history on file.    Past Medical History:  Diagnosis Date   ADHD (attention deficit hyperactivity disorder)    Anxiety    Asthma, mild intermittent    Carpal tunnel syndrome    Depression    GERD (gastroesophageal reflux disease) 05/20/2013   PTSD (post-traumatic stress disorder)     Past Surgical History:  Procedure Laterality Date   COLONOSCOPY     UPPER GI ENDOSCOPY      Current Outpatient Medications on File Prior to Visit  Medication Sig Dispense Refill   albuterol (VENTOLIN HFA) 108 (90 Base) MCG/ACT inhaler Inhale 1-2 puffs into the lungs as directed. 6.7 g 2   busPIRone (BUSPAR) 10 MG tablet Take 10 mg by mouth as needed.     cetirizine (ZYRTEC) 10 MG tablet TAKE 1 TABLET(10 MG) BY MOUTH DAILY 90 tablet 1   omeprazole (PRILOSEC) 40 MG capsule TAKE 1 CAPSULE(40 MG) BY MOUTH DAILY 90 capsule 1   No current facility-administered medications on file prior to visit.    Allergies  Allergen Reactions   Pollen Extract Anaphylaxis   Dust Mite Mixed Allergen Ext [Mite (D. Farinae)]    Molds & Smuts     Social History   Socioeconomic History   Marital status: Single    Spouse name: Not on file   Number of children: Not on file   Years of education: Not on file   Highest education level: Not on file  Occupational History   Not on file  Tobacco Use   Smoking status: Never   Smokeless tobacco: Never  Vaping Use   Vaping Use: Never used  Substance and Sexual Activity   Alcohol use: Yes    Comment: rarely   Drug use: No   Sexual activity: Never  Other  Topics Concern   Not on file  Social History Narrative   She likes to sleep, watch tv   Social Determinants of Health   Financial Resource Strain: Not on file  Food Insecurity: Food Insecurity Present   Worried About Running Out of Food in the Last Year: Sometimes true   Ran Out of Food in the Last Year: Sometimes true  Transportation Needs: No Transportation Needs   Lack of Transportation (Medical): No   Lack of Transportation (Non-Medical): No  Physical Activity: Not on file  Stress: Not on file  Social Connections: Not on file  Intimate Partner Violence: Not on file    Family History  Problem Relation Age of Onset   Bipolar disorder Father    Narcolepsy Father    Diabetes Mother    Bipolar disorder Sister    Narcolepsy Sister    Bipolar disorder Paternal Aunt     The following portions of the patient's history were reviewed and updated as appropriate: allergies, current medications, past family history, past medical history, past social history, past surgical history and problem list.  Review of Systems Pertinent items noted in HPI and remainder of comprehensive ROS otherwise negative.   Objective:  BP (!) 127/59   Pulse 82   Ht 5\' 6"  (1.676 m)  Wt 215 lb 12.8 oz (97.9 kg)   BMI 34.83 kg/m  CONSTITUTIONAL: Well-developed, well-nourished female in no acute distress.  HENT:  Normocephalic, atraumatic, External right and left ear normal. Oropharynx is clear and moist EYES: Conjunctivae and EOM are normal. Pupils are equal, round, and reactive to light. No scleral icterus.  NECK: Normal range of motion, supple, no masses.  Normal thyroid.  SKIN: Skin is warm and dry. No rash noted. Not diaphoretic. No erythema. No pallor. NEUROLGIC: Alert and oriented to person, place, and time. Normal reflexes, muscle tone coordination. No cranial nerve deficit noted. PSYCHIATRIC: Normal mood and affect. Normal behavior. Normal judgment and thought content. CARDIOVASCULAR: Normal  heart rate noted, regular rhythm RESPIRATORY: Clear to auscultation bilaterally. Effort and breath sounds normal, no problems with respiration noted. BREASTS: Deffered ABDOMEN: Soft, normal bowel sounds, no distention noted.  No tenderness, rebound or guarding.  PELVIC: Normal appearing external genitalia; normal appearing vaginal mucosa and cervix.  No abnormal discharge noted.  Pap smear obtained.  Normal uterine size, no other palpable masses, no uterine or adnexal tenderness. MUSCULOSKELETAL: Normal range of motion. No tenderness.  No cyanosis, clubbing, or edema.  2+ distal pulses.   Assessment:  Annual gynecologic examination with pap smear DUB STD testing  Plan:  Will follow up results of pap smear and manage accordingly. Will switch pt back to [facility] estrin DAW. Routine preventative health maintenance measures emphasized. Please refer to After Visit Summary for other counseling recommendations.    Hermina Staggers, MD, FACOG Attending Obstetrician & Gynecologist Center for [facility], [facility]

## 2020-10-12 ENCOUNTER — Telehealth: Payer: Self-pay

## 2020-10-12 MED ORDER — METRONIDAZOLE 500 MG PO TABS: 500.0000 mg | ORAL_TABLET | Freq: Two times a day (BID) | ORAL | 0 refills | Status: DC

## 2020-10-12 NOTE — Telephone Encounter (Signed)
-----   Message from Hermina Staggers, MD sent at 10/11/2020  6:24 PM EDT ----- Please send in Rx for Flagyl 500 mg po bid x 7 days for BV. Pt is aware Thanks Casimiro Needle

## 2020-10-12 NOTE — Telephone Encounter (Signed)
Call placed to pt. Spoke with pt. Pt given results per Dr Alysia Penna. Pt verbalized understanding and agreeable to treatment.  Rx Flagyl sent to pharmacy on file per Dr Alysia Penna.   Laney Pastor

## 2020-10-25 ENCOUNTER — Other Ambulatory Visit: Payer: Self-pay | Admitting: Lactation Services

## 2020-10-25 MED ORDER — LO LOESTRIN FE 1 MG-10 MCG / 10 MCG PO TABS: 1.0000 | ORAL_TABLET | Freq: Every day | ORAL | 3 refills | Status: DC

## 2020-10-25 NOTE — Progress Notes (Signed)
Ordered 90 day supply at patients request. 

## 2020-11-12 ENCOUNTER — Other Ambulatory Visit: Payer: Self-pay | Admitting: Adult Health

## 2021-01-26 ENCOUNTER — Other Ambulatory Visit: Payer: Self-pay | Admitting: Adult Health

## 2021-03-21 ENCOUNTER — Ambulatory Visit (INDEPENDENT_AMBULATORY_CARE_PROVIDER_SITE_OTHER): Payer: BC Managed Care – PPO | Admitting: Adult Health

## 2021-03-21 ENCOUNTER — Encounter: Payer: Self-pay | Admitting: Adult Health

## 2021-03-21 VITALS — BP 100/70 | HR 97 | Temp 99.0°F | Ht 66.0 in | Wt 233.0 lb

## 2021-03-21 DIAGNOSIS — R200 Anesthesia of skin: Secondary | ICD-10-CM

## 2021-03-21 NOTE — Progress Notes (Signed)
Subjective:    Patient ID: Robin Hoover, female    DOB: 2001/03/04, 20 y.o.   MRN: [id number]  HPI 20 year old female who  has a past medical history of ADHD (attention deficit hyperactivity disorder), Anxiety, Asthma, mild intermittent, Carpal tunnel syndrome, Depression, GERD (gastroesophageal reflux disease) (05/20/2013), and PTSD (post-traumatic stress disorder).  She presents to the office today for an acute on chronic issue of bilateral hand numbness. She has a history of carpel tunnel and feels as though this is similar to what she has felt in the past. She works as a Research scientist (medical) and believes the repetitive motions aggravated her symptoms. Symptoms were present for one month but then resolved when she went on vacation. Not currently having any symptoms. Is not wearing her wrist splints.   Review of Systems See HPI   Past Medical History:  Diagnosis Date   ADHD (attention deficit hyperactivity disorder)    Anxiety    Asthma, mild intermittent    Carpal tunnel syndrome    Depression    GERD (gastroesophageal reflux disease) 05/20/2013   PTSD (post-traumatic stress disorder)     Social History   Socioeconomic History   Marital status: Single    Spouse name: Not on file   Number of children: Not on file   Years of education: Not on file   Highest education level: Not on file  Occupational History   Not on file  Tobacco Use   Smoking status: Never   Smokeless tobacco: Never  Vaping Use   Vaping Use: Never used  Substance and Sexual Activity   Alcohol use: Yes    Comment: rarely   Drug use: No   Sexual activity: Never  Other Topics Concern   Not on file  Social History Narrative   She likes to sleep, watch tv   Social Determinants of Health   Financial Resource Strain: Not on file  Food Insecurity: Food Insecurity Present   Worried About Running Out of Food in the Last Year: Sometimes true   Ran Out of Food in the Last Year: Sometimes true   Transportation Needs: No Transportation Needs   Lack of Transportation (Medical): No   Lack of Transportation (Non-Medical): No  Physical Activity: Not on file  Stress: Not on file  Social Connections: Not on file  Intimate Partner Violence: Not on file    Past Surgical History:  Procedure Laterality Date   COLONOSCOPY     UPPER GI ENDOSCOPY      Family History  Problem Relation Age of Onset   Bipolar disorder Father    Narcolepsy Father    Diabetes Mother    Bipolar disorder Sister    Narcolepsy Sister    Bipolar disorder Paternal Aunt     Allergies  Allergen Reactions   Pollen Extract Anaphylaxis   Dust Mite Mixed Allergen Ext [Mite (D. Farinae)]    Molds & Smuts     Current Outpatient Medications on File Prior to Visit  Medication Sig Dispense Refill   albuterol (VENTOLIN HFA) 108 (90 Base) MCG/ACT inhaler Inhale 1-2 puffs into the lungs as directed. 6.7 g 2   busPIRone (BUSPAR) 10 MG tablet Take 10 mg by mouth as needed.     cetirizine (ZYRTEC) 10 MG tablet TAKE 1 TABLET(10 MG) BY MOUTH DAILY 90 tablet 1   venlafaxine XR (EFFEXOR-XR) 150 MG 24 hr capsule Take 150 mg by mouth every morning.     LO LOESTRIN FE 1 MG-10 MCG /  10 MCG tablet Take 1 tablet by mouth daily. 84 tablet 3   No current facility-administered medications on file prior to visit.    BP 100/70   Pulse 97   Temp 99 F (37.2 C) (Oral)   Ht 5\' 6"  (1.676 m)   Wt 233 lb (105.7 kg)   SpO2 99%   BMI 37.61 kg/m       Objective:   Physical Exam Vitals and nursing note reviewed.  Constitutional:      Appearance: Normal appearance.  Musculoskeletal:        General: Normal range of motion.  Skin:    General: Skin is warm and dry.     Capillary Refill: Capillary refill takes less than 2 seconds.  Neurological:     General: No focal deficit present.     Mental Status: She is alert and oriented to person, place, and time. Mental status is at baseline.     Comments: Negative tinels and phalens  test. No decreased grip strength.       Assessment & Plan:  1. Bilateral hand numbness - Likely carpel tunnel syndrome that has since resolved. Advised to wear splints at night  - Follow up as needed  , NP

## 2021-05-31 DIAGNOSIS — R69 Illness, unspecified: Secondary | ICD-10-CM | POA: Diagnosis not present

## 2021-05-31 DIAGNOSIS — F411 Generalized anxiety disorder: Secondary | ICD-10-CM | POA: Diagnosis not present

## 2021-05-31 DIAGNOSIS — F902 Attention-deficit hyperactivity disorder, combined type: Secondary | ICD-10-CM | POA: Diagnosis not present

## 2021-06-28 DIAGNOSIS — F902 Attention-deficit hyperactivity disorder, combined type: Secondary | ICD-10-CM | POA: Diagnosis not present

## 2021-06-28 DIAGNOSIS — R69 Illness, unspecified: Secondary | ICD-10-CM | POA: Diagnosis not present

## 2021-06-28 DIAGNOSIS — F411 Generalized anxiety disorder: Secondary | ICD-10-CM | POA: Diagnosis not present

## 2021-07-11 ENCOUNTER — Emergency Department (HOSPITAL_COMMUNITY): Payer: 59

## 2021-07-11 ENCOUNTER — Emergency Department (HOSPITAL_COMMUNITY)
Admission: EM | Admit: 2021-07-11 | Discharge: 2021-07-11 | Disposition: A | Discharge status: Home / Self Care | Payer: 59 | Attending: Emergency Medicine | Admitting: Emergency Medicine

## 2021-07-11 ENCOUNTER — Other Ambulatory Visit: Payer: Self-pay

## 2021-07-11 ENCOUNTER — Encounter (HOSPITAL_COMMUNITY): Payer: Self-pay

## 2021-07-11 DIAGNOSIS — Z7951 Long term (current) use of inhaled steroids: Secondary | ICD-10-CM | POA: Diagnosis not present

## 2021-07-11 DIAGNOSIS — N760 Acute vaginitis: Secondary | ICD-10-CM | POA: Insufficient documentation

## 2021-07-11 DIAGNOSIS — R1084 Generalized abdominal pain: Secondary | ICD-10-CM

## 2021-07-11 DIAGNOSIS — J4520 Mild intermittent asthma, uncomplicated: Secondary | ICD-10-CM | POA: Diagnosis not present

## 2021-07-11 DIAGNOSIS — N83202 Unspecified ovarian cyst, left side: Secondary | ICD-10-CM

## 2021-07-11 DIAGNOSIS — B9689 Other specified bacterial agents as the cause of diseases classified elsewhere: Secondary | ICD-10-CM | POA: Diagnosis not present

## 2021-07-11 DIAGNOSIS — K429 Umbilical hernia without obstruction or gangrene: Secondary | ICD-10-CM | POA: Diagnosis not present

## 2021-07-11 DIAGNOSIS — R109 Unspecified abdominal pain: Secondary | ICD-10-CM | POA: Diagnosis not present

## 2021-07-11 DIAGNOSIS — R110 Nausea: Secondary | ICD-10-CM | POA: Diagnosis not present

## 2021-07-11 DIAGNOSIS — N8302 Follicular cyst of left ovary: Secondary | ICD-10-CM | POA: Diagnosis not present

## 2021-07-11 LAB — URINALYSIS, ROUTINE W REFLEX MICROSCOPIC
Bacteria, UA: NONE SEEN
Bilirubin Urine: NEGATIVE
Glucose, UA: NEGATIVE mg/dL
Hgb urine dipstick: NEGATIVE
Ketones, ur: NEGATIVE mg/dL
Nitrite: NEGATIVE
Protein, ur: NEGATIVE mg/dL
Specific Gravity, Urine: 1.02 (ref 1.005–1.030)
pH: 5 (ref 5.0–8.0)

## 2021-07-11 LAB — COMPREHENSIVE METABOLIC PANEL
ALT: 22 U/L (ref 0–44)
AST: 23 U/L (ref 15–41)
Albumin: 4.1 g/dL (ref 3.5–5.0)
Alkaline Phosphatase: 99 U/L (ref 38–126)
Anion gap: 7 (ref 5–15)
BUN: 19 mg/dL (ref 6–20)
CO2: 27 mmol/L (ref 22–32)
Calcium: 8.5 mg/dL — ABNORMAL LOW (ref 8.9–10.3)
Chloride: 101 mmol/L (ref 98–111)
Creatinine, Ser: 0.82 mg/dL (ref 0.44–1.00)
GFR, Estimated: >60 mL/min (ref >60)
Glucose, Bld: 96 mg/dL (ref 70–99)
Potassium: 4.1 mmol/L (ref 3.5–5.1)
Sodium: 135 mmol/L (ref 135–145)
Total Bilirubin: 0.3 mg/dL (ref 0.3–1.2)
Total Protein: 7.6 g/dL (ref 6.5–8.1)

## 2021-07-11 LAB — CBC
HCT: 43.8 % (ref 36.0–46.0)
Hemoglobin: 14.2 g/dL (ref 12.0–15.0)
MCH: 26 pg (ref 26.0–34.0)
MCHC: 32.4 g/dL (ref 30.0–36.0)
MCV: 80.2 fL (ref 80.0–100.0)
Platelets: 357 10*3/uL (ref 150–400)
RBC: 5.46 MIL/uL — ABNORMAL HIGH (ref 3.87–5.11)
RDW: 14.3 % (ref 11.5–15.5)
WBC: 9.1 10*3/uL (ref 4.0–10.5)
nRBC: 0 % (ref 0.0–0.2)

## 2021-07-11 LAB — I-STAT BETA HCG BLOOD, ED (MC, WL, AP ONLY): I-stat hCG, quantitative: <5 m[IU]/mL (ref <5)

## 2021-07-11 LAB — LIPASE, BLOOD: Lipase: 24 U/L (ref 11–51)

## 2021-07-11 MED ORDER — SODIUM CHLORIDE (PF) 0.9 % IJ SOLN
INTRAMUSCULAR | Status: AC
  Filled 2021-07-11: qty 50

## 2021-07-11 MED ORDER — DICYCLOMINE HCL 20 MG PO TABS: 20.0000 mg | ORAL_TABLET | Freq: Two times a day (BID) | ORAL | 0 refills | Status: DC

## 2021-07-11 MED ORDER — ONDANSETRON 4 MG PO TBDP: 4.0000 mg | ORAL_TABLET | Freq: Three times a day (TID) | ORAL | 0 refills | Status: DC | PRN

## 2021-07-11 MED ORDER — METRONIDAZOLE 500 MG PO TABS: 500.0000 mg | ORAL_TABLET | Freq: Two times a day (BID) | ORAL | 0 refills | Status: DC

## 2021-07-11 MED ORDER — ONDANSETRON HCL 4 MG/2ML IJ SOLN
4.0000 mg | Freq: Once | INTRAMUSCULAR | Status: AC
Start: 2021-07-11 — End: 2021-07-11
  Administered 2021-07-11: 4 mg via INTRAVENOUS
  Filled 2021-07-11: qty 2

## 2021-07-11 MED ORDER — SODIUM CHLORIDE 0.9 % IV BOLUS
1000.0000 mL | Freq: Once | INTRAVENOUS | Status: AC
  Administered 2021-07-11: 1000 mL via INTRAVENOUS

## 2021-07-11 MED ORDER — MORPHINE SULFATE (PF) 4 MG/ML IV SOLN
4.0000 mg | Freq: Once | INTRAVENOUS | Status: AC
  Administered 2021-07-11: 4 mg via INTRAVENOUS
  Filled 2021-07-11: qty 1

## 2021-07-11 MED ORDER — IOHEXOL 300 MG/ML  SOLN
100.0000 mL | Freq: Once | INTRAMUSCULAR | Status: AC | PRN
  Administered 2021-07-11: 100 mL via INTRAVENOUS

## 2021-07-11 NOTE — ED Notes (Signed)
Patient returned back from CT at this time.  °

## 2021-07-11 NOTE — ED Provider Notes (Signed)
?[facility]-EMERGENCY DEPT ?Provider Note ? ? ?CSN: [id number] ?Arrival date & time: 07/11/21  1107 ? ?  ? ?History ? ?Chief Complaint  ?Patient presents with  ? Abdominal Pain  ? ? ?Robin Hoover is a 21 y.o. female. ? ?21 year old female presents today for evaluation of abdominal pain associated with nausea and without vomiting since this morning.  Denies episodes of abdominal pain prior to today.  She denies associated fever, constipation, diarrhea, vaginal discharge, vaginal bleeding, dysuria.  She is not sexually active.  She does not have history of abdominal surgeries.  She has not tried anything prior to arrival that is helped. ? ?The history is provided by the patient. No language interpreter was used.  ? ?  ? ?Home Medications ?Prior to Admission medications   ?Medication Sig Start Date End Date Taking? Authorizing Provider  ?albuterol (VENTOLIN HFA) 108 (90 Base) MCG/ACT inhaler Inhale 1-2 puffs into the lungs as directed. 04/05/20   Nafziger, Kandee Keenory, NP  ?busPIRone (BUSPAR) 10 MG tablet Take 10 mg by mouth as needed.    [provider]  ?cetirizine (ZYRTEC) 10 MG tablet TAKE 1 TABLET(10 MG) BY MOUTH DAILY 11/14/20   Shirline FreesNafziger, Cory, NP  ?LO LOESTRIN FE 1 MG-10 MCG / 10 MCG tablet Take 1 tablet by mouth daily. 10/25/20 01/17/21  Hermina StaggersErvin, Michael L, MD  ?venlafaxine XR (EFFEXOR-XR) 150 MG 24 hr capsule Take 150 mg by mouth every morning. 01/26/21   [provider]  ?   ? ?Allergies    ?Pollen extract, Dust mite mixed allergen ext [mite (d. farinae)], and Molds & smuts   ? ?Review of Systems   ?Review of Systems  ?Constitutional:  Negative for activity change, chills and fever.  ?Respiratory:  Negative for shortness of breath.   ?Cardiovascular:  Negative for chest pain.  ?Gastrointestinal:  Positive for abdominal pain and nausea. Negative for abdominal distention, blood in stool, constipation, diarrhea and vomiting.  ?Genitourinary:  Negative for dysuria, flank pain,  vaginal bleeding and vaginal discharge.  ?Neurological:  Negative for weakness and light-headedness.  ?All other systems reviewed and are negative. ? ?Physical Exam ?Updated Vital Signs ?BP 138/63   Pulse 85   Temp 98.1 ?F (36.7 ?C) (Oral)   Resp 18   Ht 5\' 6"  (1.676 m)   Wt 102.1 kg   LMP 06/26/2021 (Approximate)   SpO2 100%   BMI 36.32 kg/m?  ?Physical Exam ?Vitals and nursing note reviewed.  ?Constitutional:   ?   General: She is not in acute distress. ?   Appearance: Normal appearance. She is not ill-appearing.  ?HENT:  ?   Head: Normocephalic and atraumatic.  ?   Nose: Nose normal.  ?Eyes:  ?   General: No scleral icterus. ?   Extraocular Movements: Extraocular movements intact.  ?   Conjunctiva/sclera: Conjunctivae normal.  ?Cardiovascular:  ?   Rate and Rhythm: Normal rate and regular rhythm.  ?   Pulses: Normal pulses.  ?   Heart sounds: Normal heart sounds.  ?Pulmonary:  ?   Effort: Pulmonary effort is normal. No respiratory distress.  ?   Breath sounds: Normal breath sounds. No wheezing or rales.  ?Abdominal:  ?   General: There is no distension.  ?   Tenderness: There is abdominal tenderness (Diffuse abdominal tenderness worse in the right upper quadrant and the epigastric region.). There is no right CVA tenderness, left CVA tenderness, guarding or rebound.  ?Musculoskeletal:     ?   General:  Normal range of motion.  ?   Cervical back: Normal range of motion.  ?Skin: ?   General: Skin is warm and dry.  ?Neurological:  ?   General: No focal deficit present.  ?   Mental Status: She is alert. Mental status is at baseline.  ? ? ?ED Results / Procedures / Treatments   ?Labs ?(all labs ordered are listed, but only abnormal results are displayed) ?Labs Reviewed  ?CBC - Abnormal; Notable for the following components:  ?    Result Value  ? RBC 5.46 (*)   ? All other components within normal limits  ?URINALYSIS, ROUTINE W REFLEX MICROSCOPIC - Abnormal; Notable for the following components:  ? APPearance  HAZY (*)   ? Leukocytes,Ua MODERATE (*)   ? All other components within normal limits  ?LIPASE, BLOOD  ?COMPREHENSIVE METABOLIC PANEL  ?I-STAT BETA HCG BLOOD, ED ([facility], [facility], [facility] ONLY)  ? ? ?EKG ?None ? ?Radiology ?No results found. ? ?Procedures ?Procedures  ? ? ?Medications Ordered in ED ?Medications  ?sodium chloride 0.9 % bolus 1,000 mL (has no administration in time range)  ?morphine (PF) 4 MG/ML injection 4 mg (has no administration in time range)  ?ondansetron (ZOFRAN) injection 4 mg (has no administration in time range)  ? ? ?ED Course/ Medical Decision Making/ A&P ?  ?                        ?Medical Decision Making ?Amount and/or Complexity of Data Reviewed ?Labs: ordered. ?Radiology: ordered. ? ?Risk ?Prescription drug management. ? ? ?Medical Decision Making / ED Course ? ? ?This patient presents to the ED for concern of abdominal pain, this involves an extensive number of treatment options, and is a complaint that carries with it a high risk of complications and morbidity.  The differential diagnosis includes appendicitis, cholecystitis, pancreatitis, UTI, PID, constipation, SBO ? ?MDM: ?21 year old female presents today for 1 day duration of abdominal pain, nausea.  She denies vomiting.  She denies any recent illness.  She denies dysuria, urinary frequency, bowel habit change, inability to tolerate p.o. intake.  She is without shortness of breath, or chest pain.  She is not sexually active.  CBC without leukocytosis, or anemia.  CMP unremarkable with exception of calcium of 8.5.  UA with moderate leukocytes and nitrite negative.  hCG negative.  Lipase within normal limits.  CT abdomen pelvis without acute intra-abdominal process.  Has a smooth noncomplex left ovarian cyst.  On exam patient's abdominal pain is most prominent in the right upper quadrant in the epigastric region.  With minimal tenderness in the rest of the quadrants.  Significant improvement with pain medication that was provided in the  emergency room.  On further questioning patient also reports she was diagnosed with BV 2 months ago but she never picked up the medications or followed up with her treatment.  Discussed STD testing however patient refuses.  I offered to send in Flagyl to treat her previously diagnosed BV.  She is in agreement.  We will also obtain a urine culture given she has moderate leukocytes to ensure she does not have a active UTI.  She is asymptomatic from a UTI standpoint.  Patient voices understanding and is in agreement with plan.  Patient is appropriate for discharge.  Discharged in stable condition.  She is nontoxic-appearing.  She has nonsurgical abdomen.  She remained with normal sinus rhythm.  ? ? ?Lab Tests: ?-I ordered, reviewed, and interpreted labs.   ?  The pertinent results include:   ?Labs Reviewed  ?COMPREHENSIVE METABOLIC PANEL - Abnormal; Notable for the following components:  ?    Result Value  ? Calcium 8.5 (*)   ? All other components within normal limits  ?CBC - Abnormal; Notable for the following components:  ? RBC 5.46 (*)   ? All other components within normal limits  ?URINALYSIS, ROUTINE W REFLEX MICROSCOPIC - Abnormal; Notable for the following components:  ? APPearance HAZY (*)   ? Leukocytes,Ua MODERATE (*)   ? All other components within normal limits  ?LIPASE, BLOOD  ?I-STAT BETA HCG BLOOD, ED ([facility], [facility], [facility] ONLY)  ?  ? ? ?EKG ? EKG Interpretation ? ?Date/Time:    ?Ventricular Rate:    ?PR Interval:    ?QRS Duration:   ?QT Interval:    ?QTC Calculation:   ?R Axis:     ?Text Interpretation:   ?  ? ?  ? ? ? ?Imaging Studies ordered: ?I ordered imaging studies including CT abdomen pelvis with contrast ?I independently visualized and interpreted imaging. ?I agree with the radiologist interpretation ? ? ?Medicines ordered and prescription drug management: ?Meds ordered this encounter  ?Medications  ? sodium chloride 0.9 % bolus 1,000 mL  ? morphine (PF) 4 MG/ML injection 4 mg  ? ondansetron (ZOFRAN)  injection 4 mg  ? iohexol (OMNIPAQUE) 300 MG/ML solution 100 mL  ? sodium chloride (PF) 0.9 % injection  ?  Lanny Hurst L: cabinet override  ?  ?-I have reviewed the patients home medicines and have made adjustments as nee

## 2021-07-11 NOTE — ED Notes (Signed)
Patient transported to CT 

## 2021-07-11 NOTE — Discharge Instructions (Addendum)
Your work-up today was reassuring and without a concerning cause of your abdominal pain.  I have sent in some medication to help with your nausea, abdominal pain.  You also mention you were diagnosed with BV a couple months ago that she did not follow through with on the treatment.  I have sent in Flagyl to the pharmacy for you.  If you have any worsening abdominal pain, or continued concerning symptoms please return to the emergency room for evaluation.  Otherwise follow-up with your PCP. ?As discussed you also had an ovarian cyst on the left ovary.  This is likely a benign finding and not the cause of your symptoms.  You can follow-up with urogynecology or your primary care provider to keep follow-up on this. ?

## 2021-07-11 NOTE — ED Triage Notes (Signed)
Patient c/o mid and lower abdominal pain and nausea since this AM. ?

## 2021-07-12 ENCOUNTER — Encounter: Payer: Self-pay | Admitting: Adult Health

## 2021-07-12 ENCOUNTER — Ambulatory Visit: Payer: 59 | Admitting: Adult Health

## 2021-07-12 VITALS — BP 110/68 | HR 85 | Temp 98.6°F | Ht 66.0 in | Wt 228.0 lb

## 2021-07-12 DIAGNOSIS — R1084 Generalized abdominal pain: Secondary | ICD-10-CM

## 2021-07-12 DIAGNOSIS — N760 Acute vaginitis: Secondary | ICD-10-CM | POA: Diagnosis not present

## 2021-07-12 DIAGNOSIS — B9689 Other specified bacterial agents as the cause of diseases classified elsewhere: Secondary | ICD-10-CM

## 2021-07-12 NOTE — Progress Notes (Signed)
? ?Subjective:  ? ? Patient ID: Robin Hoover, female    DOB: January 26, 2001, 21 y.o.   MRN: [id number] ? ?HPI ? ?21 year old female who  has a past medical history of ADHD (attention deficit hyperactivity disorder), Anxiety, Asthma, mild intermittent, Carpal tunnel syndrome, Depression, GERD (gastroesophageal reflux disease) (05/20/2013), and PTSD (post-traumatic stress disorder). ? ?She presents to the office today for follow-up after being seen in the emergency room at [facility] yesterday for evaluation associated with abdominal pain and nausea without vomiting that started earlier yesterday morning.  She denies associated fevers, constipation, diarrhea, no vaginal discharge, vaginal bleeding, or dysuria.  She is not sexually active. ? ?On exam she had diffuse abdominal tenderness worse in the right upper quadrant and epigastric region.  There was no CVA tenderness ? ?CBC did not show leukocytosis or anemia.  CMP was unremarkable with the exception of calcium of 8.5.  UA with moderate leukocytes and nitrate negative.  HCT was negative.  Lipase within normal limits.  CT abdomen pelvis without acute intra-abdominal process.  It was noted that she had a smooth noncomplex left ovarian cyst.  On exam her abdominal pain was mostly prominent in the right upper quadrant and epigastric region. ? ?She did have significant improvement with morphine while in the ER. ? ?She also reported being diagnosed with BV 2 months ago but never picked up the medication or followed up with her treatment. ? ?Flagyl was sent in  ? ?She continues to have right upper and epigastric abdominal pain. Has not started her abx yet.  ? ?No fevers or chills  ? ? ?Review of Systems ?See HPI  ? ?Past Medical History:  ?Diagnosis Date  ? ADHD (attention deficit hyperactivity disorder)   ? Anxiety   ? Asthma, mild intermittent   ? Carpal tunnel syndrome   ? Depression   ? GERD (gastroesophageal reflux disease) 05/20/2013  ? PTSD  (post-traumatic stress disorder)   ? ? ?Social History  ? ?Socioeconomic History  ? Marital status: Single  ?  Spouse name: Not on file  ? Number of children: Not on file  ? Years of education: Not on file  ? Highest education level: Not on file  ?Occupational History  ? Not on file  ?Tobacco Use  ? Smoking status: Never  ? Smokeless tobacco: Never  ?Vaping Use  ? Vaping Use: Never used  ?Substance and Sexual Activity  ? Alcohol use: Yes  ?  Comment: rarely  ? Drug use: Yes  ? Sexual activity: Never  ?Other Topics Concern  ? Not on file  ?Social History Narrative  ? She likes to sleep, watch tv  ? ?Social Determinants of Health  ? ?Financial Resource Strain: Not on file  ?Food Insecurity: Food Insecurity Present  ? Worried About Charity fundraiser in the Last Year: Sometimes true  ? Ran Out of Food in the Last Year: Sometimes true  ?Transportation Needs: No Transportation Needs  ? Lack of Transportation (Medical): No  ? Lack of Transportation (Non-Medical): No  ?Physical Activity: Not on file  ?Stress: Not on file  ?Social Connections: Not on file  ?Intimate Partner Violence: Not on file  ? ? ?Past Surgical History:  ?Procedure Laterality Date  ? COLONOSCOPY    ? UPPER GI ENDOSCOPY    ? ? ?Family History  ?Problem Relation Age of Onset  ? Bipolar disorder Father   ? Narcolepsy Father   ? Diabetes Mother   ? Bipolar  disorder Sister   ? Narcolepsy Sister   ? Bipolar disorder Paternal Aunt   ? ? ?Allergies  ?Allergen Reactions  ? Pollen Extract Anaphylaxis  ? Dust Mite Mixed Allergen Ext [Mite (D. Farinae)]   ? Molds & Smuts   ? ? ?Current Outpatient Medications on File Prior to Visit  ?Medication Sig Dispense Refill  ? albuterol (VENTOLIN HFA) 108 (90 Base) MCG/ACT inhaler Inhale 1-2 puffs into the lungs as directed. 6.7 g 2  ? busPIRone (BUSPAR) 10 MG tablet Take 10 mg by mouth as needed.    ? cetirizine (ZYRTEC) 10 MG tablet TAKE 1 TABLET(10 MG) BY MOUTH DAILY 90 tablet 1  ? dicyclomine (BENTYL) 20 MG tablet Take 1  tablet (20 mg total) by mouth 2 (two) times daily. 20 tablet 0  ? LO LOESTRIN FE 1 MG-10 MCG / 10 MCG tablet Take 1 tablet by mouth daily. 84 tablet 3  ? metroNIDAZOLE (FLAGYL) 500 MG tablet Take 1 tablet (500 mg total) by mouth 2 (two) times daily. 14 tablet 0  ? ondansetron (ZOFRAN-ODT) 4 MG disintegrating tablet Take 1 tablet (4 mg total) by mouth every 8 (eight) hours as needed for nausea or vomiting. 20 tablet 0  ? venlafaxine XR (EFFEXOR-XR) 150 MG 24 hr capsule Take 150 mg by mouth every morning.    ? ?No current facility-administered medications on file prior to visit.  ? ? ?LMP 06/26/2021  ? ? ?   ?Objective:  ? Physical Exam ?Vitals and nursing note reviewed.  ?Constitutional:   ?   Appearance: Normal appearance.  ?Cardiovascular:  ?   Rate and Rhythm: Normal rate and regular rhythm.  ?   Pulses: Normal pulses.  ?   Heart sounds: Normal heart sounds.  ?Pulmonary:  ?   Effort: Pulmonary effort is normal.  ?   Breath sounds: Normal breath sounds.  ?Abdominal:  ?   General: Bowel sounds are normal.  ?   Tenderness: There is abdominal tenderness in the right upper quadrant, epigastric area and periumbilical area.  ?Skin: ?   General: Skin is warm and dry.  ?Neurological:  ?   General: No focal deficit present.  ?   Mental Status: She is alert and oriented to person, place, and time.  ?Psychiatric:     ?   Mood and Affect: Mood normal.     ?   Behavior: Behavior normal.     ?   Thought Content: Thought content normal.     ?   Judgment: Judgment normal.  ? ?   ?Assessment & Plan:  ?- Encouraged to take Flagyl as directed. Follow up if abd pain continues  ? ?Dorothyann Peng, NP ? ? ?

## 2021-07-31 DIAGNOSIS — F411 Generalized anxiety disorder: Secondary | ICD-10-CM | POA: Diagnosis not present

## 2021-07-31 DIAGNOSIS — F902 Attention-deficit hyperactivity disorder, combined type: Secondary | ICD-10-CM | POA: Diagnosis not present

## 2021-07-31 DIAGNOSIS — R69 Illness, unspecified: Secondary | ICD-10-CM | POA: Diagnosis not present

## 2021-08-19 DIAGNOSIS — N390 Urinary tract infection, site not specified: Secondary | ICD-10-CM | POA: Diagnosis not present

## 2021-08-20 ENCOUNTER — Other Ambulatory Visit: Payer: Self-pay | Admitting: Adult Health

## 2021-08-21 ENCOUNTER — Encounter: Payer: Self-pay | Admitting: Emergency Medicine

## 2021-08-21 ENCOUNTER — Ambulatory Visit
Admission: EM | Admit: 2021-08-21 | Discharge: 2021-08-21 | Disposition: A | Discharge status: Home / Self Care | Payer: 59 | Attending: Emergency Medicine | Admitting: Emergency Medicine

## 2021-08-21 DIAGNOSIS — N390 Urinary tract infection, site not specified: Secondary | ICD-10-CM

## 2021-08-21 HISTORY — DX: Premenstrual dysphoric disorder: F32.81

## 2021-08-21 LAB — POCT URINALYSIS DIP (MANUAL ENTRY)
Bilirubin, UA: NEGATIVE
Blood, UA: NEGATIVE
Glucose, UA: NEGATIVE mg/dL
Ketones, POC UA: NEGATIVE mg/dL
Nitrite, UA: POSITIVE — AB
Protein Ur, POC: NEGATIVE mg/dL
Spec Grav, UA: 1.015 (ref 1.010–1.025)
Urobilinogen, UA: 0.2 E.U./dL
pH, UA: 7.5 (ref 5.0–8.0)

## 2021-08-21 LAB — POCT URINE PREGNANCY: Preg Test, Ur: NEGATIVE

## 2021-08-21 MED ORDER — SULFAMETHOXAZOLE-TRIMETHOPRIM 800-160 MG PO TABS: 1.0000 | ORAL_TABLET | Freq: Two times a day (BID) | ORAL | 0 refills | Status: AC

## 2021-08-21 MED ORDER — FLUCONAZOLE 150 MG PO TABS: ORAL_TABLET | ORAL | 0 refills | Status: DC

## 2021-08-21 NOTE — ED Provider Notes (Signed)
?[facility] ? ? ? ?CSN: [id number] ?Arrival date & time: 08/21/21  1400 ?  ? ?HISTORY  ? ?Chief Complaint  ?Patient presents with  ? Urinary Frequency  ? Dysuria  ? ?HPI ?Robin Hoover is a 21 y.o. female. Patient c/o urinary frequency and dysuria x 1 week. Patient denies fever. Patient denies abnormal vaginal discharge. Patient endorses nausea at times. Patient endorses bilateral back pain at times. Patient was seen at [facility] 2 days ago. Patient was prescribed AZO and Nitrofurantoin which she started that day but has had no relief of symptoms.  ? ?  ? ?The history is provided by the patient.  ?Past Medical History:  ?Diagnosis Date  ? ADHD (attention deficit hyperactivity disorder)   ? Anxiety   ? Asthma, mild intermittent   ? Carpal tunnel syndrome   ? Depression   ? GERD (gastroesophageal reflux disease) 05/20/2013  ? PMDD (premenstrual dysphoric disorder)   ? PTSD (post-traumatic stress disorder)   ? ?Patient Active Problem List  ? Diagnosis Date Noted  ? Visit for routine gyn exam 10/10/2020  ? Screening for STD (sexually transmitted disease) 10/10/2020  ? DUB (dysfunctional uterine bleeding) 10/10/2020  ? Migraines 01/28/2019  ? MDD (major depressive disorder), recurrent episode, severe (HCC) 05/20/2013  ? Generalized anxiety disorder 05/12/2013  ? ADHD (attention deficit hyperactivity disorder), combined type 03/26/2013  ? GERD (gastroesophageal reflux disease) 03/26/2013  ? Mild intermittent asthma 03/26/2013  ? ?Past Surgical History:  ?Procedure Laterality Date  ? COLONOSCOPY    ? UPPER GI ENDOSCOPY    ? ?OB History   ?No obstetric history on file. ?  ? ?Home Medications   ? ?Prior to Admission medications   ?Medication Sig Start Date End Date Taking? Authorizing Provider  ?atomoxetine (STRATTERA) 40 MG capsule Take 25 mg by mouth every morning. 07/02/21  Yes [provider]  ?busPIRone (BUSPAR) 10 MG tablet Take 10 mg by mouth as needed.   Yes  [provider]  ?cetirizine (ZYRTEC) 10 MG tablet TAKE 1 TABLET(10 MG) BY MOUTH DAILY 11/14/20  Yes Nafziger, Kandee Keenory, NP  ?omeprazole (PRILOSEC) 40 MG capsule Take 40 mg by mouth daily. 06/14/21  Yes [provider]  ?venlafaxine XR (EFFEXOR-XR) 150 MG 24 hr capsule Take 150 mg by mouth every morning. 01/26/21  Yes [provider]  ?albuterol (VENTOLIN HFA) 108 (90 Base) MCG/ACT inhaler Inhale 1-2 puffs into the lungs as directed. 04/05/20   Shirline FreesNafziger, Cory, NP  ? ?Family History ?Family History  ?Problem Relation Age of Onset  ? Bipolar disorder Father   ? Narcolepsy Father   ? Diabetes Mother   ? Bipolar disorder Sister   ? Narcolepsy Sister   ? Bipolar disorder Paternal Aunt   ? ?Social History ?Social History  ? ?Tobacco Use  ? Smoking status: Never  ? Smokeless tobacco: Never  ?Vaping Use  ? Vaping Use: Never used  ?Substance Use Topics  ? Alcohol use: Yes  ?  Comment: rarely  ? Drug use: Yes  ? ?Allergies   ?Pollen extract, Dust mite mixed allergen ext [mite (d. farinae)], and Molds & smuts ? ?Review of Systems ?Review of Systems ?Pertinent findings noted in history of present illness.  ? ?Physical Exam ?Triage Vital Signs ?ED Triage Vitals  ?Enc Vitals Group  ?   BP 02/17/21 0827 (!) 147/82  ?   Pulse Rate 02/17/21 0827 72  ?   Resp 02/17/21 0827 18  ?   Temp  02/17/21 0827 98.3 ?F (36.8 ?C)  ?   Temp Source 02/17/21 0827 Oral  ?   SpO2 02/17/21 0827 98 %  ?   Weight --   ?   Height --   ?   Head Circumference --   ?   Peak Flow --   ?   Pain Score 02/17/21 0826 5  ?   Pain Loc --   ?   Pain Edu? --   ?   Excl. in GC? --   ?No data found. ? ?Updated Vital Signs ?BP 111/71 (BP Location: Right Arm)   Pulse 73   Temp 98.8 ?F (37.1 ?C) (Oral)   Resp 12   LMP 07/22/2021 (Approximate)   SpO2 96%  ? ?Physical Exam ?Vitals and nursing note reviewed.  ?Constitutional:   ?   General: She is not in acute distress. ?   Appearance: Normal appearance. She is not ill-appearing.  ?HENT:  ?   Head:  Normocephalic and atraumatic.  ?Eyes:  ?   General: Lids are normal.     ?   Right eye: No discharge.     ?   Left eye: No discharge.  ?   Extraocular Movements: Extraocular movements intact.  ?   Conjunctiva/sclera: Conjunctivae normal.  ?   Right eye: Right conjunctiva is not injected.  ?   Left eye: Left conjunctiva is not injected.  ?Neck:  ?   Trachea: Trachea and phonation normal.  ?Cardiovascular:  ?   Rate and Rhythm: Normal rate and regular rhythm.  ?   Pulses: Normal pulses.  ?   Heart sounds: Normal heart sounds. No murmur heard. ?  No friction rub. No gallop.  ?Pulmonary:  ?   Effort: Pulmonary effort is normal. No accessory muscle usage, prolonged expiration or respiratory distress.  ?   Breath sounds: Normal breath sounds. No stridor, decreased air movement or transmitted upper airway sounds. No decreased breath sounds, wheezing, rhonchi or rales.  ?Chest:  ?   Chest wall: No tenderness.  ?Abdominal:  ?   General: Abdomen is flat. Bowel sounds are normal. There is no distension.  ?   Palpations: Abdomen is soft.  ?   Tenderness: There is abdominal tenderness in the suprapubic area. There is no right CVA tenderness or left CVA tenderness.  ?   Hernia: No hernia is present.  ?Musculoskeletal:     ?   General: Normal range of motion.  ?   Cervical back: Normal range of motion and neck supple. Normal range of motion.  ?Lymphadenopathy:  ?   Cervical: No cervical adenopathy.  ?Skin: ?   General: Skin is warm and dry.  ?   Findings: No erythema or rash.  ?Neurological:  ?   General: No focal deficit present.  ?   Mental Status: She is alert and oriented to person, place, and time.  ?Psychiatric:     ?   Mood and Affect: Mood normal.     ?   Behavior: Behavior normal.  ? ? ?Visual Acuity ?Right Eye Distance:   ?Left Eye Distance:   ?Bilateral Distance:   ? ?Right Eye Near:   ?Left Eye Near:    ?Bilateral Near:    ? ?UC Couse / Diagnostics / Procedures:  ?  ?EKG ? ?Radiology ?No results  found. ? ?Procedures ?Procedures (including critical care time) ? ?UC Diagnoses / Final Clinical Impressions(s)   ?I have reviewed the triage vital signs and the nursing notes. ? ?  Pertinent labs & imaging results that were available during my care of the patient were reviewed by me and considered in my medical decision making (see chart for details).   ? ?Final diagnoses:  ?Lower urinary tract infection, acute  ? ?Urine dip is nitrite positive, unfortunately she has been taking Azo so this is unreliable finding.  Patient advised that we will send urine for culture and I recommend that she discontinue Macrobid at this time because it is not helping.  Also advised that she can stop Azo as she should only take that for a few days and then quit.  I provided her with a prescription for Bactrim while we wait for the results of her urine culture. ? ? ? ?ED Prescriptions   ? ? Medication Sig Dispense Auth. Provider  ? sulfamethoxazole-trimethoprim (BACTRIM DS) 800-160 MG tablet Take 1 tablet by mouth 2 (two) times daily for 5 days. 10 tablet Theadora Rama Scales, PA-C  ? fluconazole (DIFLUCAN) 150 MG tablet Take 1 tablet on day 4 of antibiotics.  Take second tablet 3 days later. 2 tablet Theadora Rama Scales, PA-C  ? ?  ? ?PDMP not reviewed this encounter. ? ?Pending results:  ?Labs Reviewed  ?POCT URINALYSIS DIP (MANUAL ENTRY) - Abnormal; Notable for the following components:  ?    Result Value  ? Nitrite, UA Positive (*)   ? Leukocytes, UA Trace (*)   ? All other components within normal limits  ?URINE CULTURE  ?POCT URINE PREGNANCY  ? ? ?Medications Ordered in UC: ?Medications - No data to display ? ?Disposition Upon Discharge:  ?Condition: stable for discharge home ? ?Patient presented with concern for an acute illness with associated systemic symptoms and significant discomfort requiring urgent management. In my opinion, this is a condition that a prudent lay person (someone who possesses an average knowledge of  health and medicine) may potentially expect to result in complications if not addressed urgently such as respiratory distress, impairment of bodily function or dysfunction of bodily organs.  ? ?As such, the patient has been evaluated and a

## 2021-08-21 NOTE — ED Triage Notes (Addendum)
Patient c/o urinary frequency and dysuria x 1 week.  ? ?Patient denies fever. Patient denies abnormal vaginal discharge.  ? ?Patient endorses nausea at times.  ? ?Patient endorses bilateral back pain at times.  ? ?Patient was seen at [facility] on Saturday.  ? ?Patient has taken AZO and Nitrofurantoin with no relief of symptoms (Patient started medication on Saturday).  ? ? ? ? ?

## 2021-08-21 NOTE — Discharge Instructions (Addendum)
You were advised to begin antibiotics today because you are having active symptoms of a urinary tract infection that have not resolved with your initial antibiotic, nitrofurantoin (Macrobid).  At this point, you should also discontinue Azo as it is likely no longer providing you with any benefit.   ? ?As I am sure you are aware and just as a reminder, it is very important that you take all doses exactly as prescribed.  Incomplete antibiotic therapy can cause worsening urinary tract infection that can become aggressive, reach the level of your kidneys causing kidney infection and possible hospitalization. ? ?Because we know that antibiotic treatment can often cause vaginal yeast infections, I have also provided you with a prescription for fluconazole (Diflucan).  Please take the first tablet on the third day of your antibiotics and take the second tablet two days after the first tablet if you notice that you are having an abnormal amount of vaginal discharge, your vaginal discharge has become white and chunky looking or you notice that you are having uncomfortable vaginal itching. ? ?Urine culture will be performed per our protocol, you will be contacted in the next 2 to 3 days with the results of that culture.  If further treatment is, this will be provided for you as well. ?  ?If you have not had complete resolution of your symptoms after completing treatment as prescribed, please return to urgent care for repeat evaluation or follow-up with your primary care provider. ?  ?Thank you for visiting urgent care today.  I appreciate the opportunity to participate in your care. ? ?

## 2021-08-22 ENCOUNTER — Ambulatory Visit: Payer: 59

## 2021-08-22 ENCOUNTER — Encounter: Payer: Self-pay | Admitting: Adult Health

## 2021-08-23 LAB — URINE CULTURE

## 2021-08-23 NOTE — Telephone Encounter (Signed)
Please advise 

## 2021-09-20 ENCOUNTER — Other Ambulatory Visit: Payer: Self-pay | Admitting: Adult Health

## 2021-09-20 ENCOUNTER — Encounter: Payer: Self-pay | Admitting: Emergency Medicine

## 2021-09-20 ENCOUNTER — Ambulatory Visit
Admission: EM | Admit: 2021-09-20 | Discharge: 2021-09-20 | Disposition: A | Discharge status: Home / Self Care | Payer: 59 | Attending: Urgent Care | Admitting: Urgent Care

## 2021-09-20 DIAGNOSIS — K529 Noninfective gastroenteritis and colitis, unspecified: Secondary | ICD-10-CM | POA: Diagnosis not present

## 2021-09-20 DIAGNOSIS — R197 Diarrhea, unspecified: Secondary | ICD-10-CM

## 2021-09-20 LAB — POCT URINALYSIS DIP (MANUAL ENTRY)
Bilirubin, UA: NEGATIVE
Glucose, UA: NEGATIVE mg/dL
Ketones, POC UA: NEGATIVE mg/dL
Nitrite, UA: NEGATIVE
Protein Ur, POC: NEGATIVE mg/dL
Spec Grav, UA: 1.025 (ref 1.010–1.025)
Urobilinogen, UA: 0.2 E.U./dL
pH, UA: 5.5 (ref 5.0–8.0)

## 2021-09-20 LAB — POCT URINE PREGNANCY: Preg Test, Ur: NEGATIVE

## 2021-09-20 MED ORDER — ONDANSETRON 8 MG PO TBDP: 8.0000 mg | ORAL_TABLET | Freq: Three times a day (TID) | ORAL | 0 refills | Status: DC | PRN

## 2021-09-20 NOTE — ED Provider Notes (Signed)
[facility]   MRN: [id number] DOB: February 15, 2001  Subjective:   Robin Hoover is a 21 y.o. female presenting for 3-day history of acute onset persistent diarrhea.  Was having multiple bouts throughout the day and started to respond to Imodium which she took last night but then started all over again this morning.  She has also had nausea but no vomiting.  Patient did undergo a course of antibiotics for suspected urinary tract infection from 08/21/2021.  Reports that her bowels typically have a difficult time when she gets stressed.  No history of IBS but suspects that she may have this.  No fever, chest pain, shortness of breath, bloody diarrhea.  No history of ulcerative colitis, Crohn's or any diagnosed GI disorder.  No current facility-administered medications for this encounter.  Current Outpatient Medications:    albuterol (VENTOLIN HFA) 108 (90 Base) MCG/ACT inhaler, Inhale 1-2 puffs into the lungs as directed., Disp: 6.7 g, Rfl: 2   atomoxetine (STRATTERA) 40 MG capsule, Take 25 mg by mouth every morning., Disp: , Rfl:    busPIRone (BUSPAR) 10 MG tablet, Take 10 mg by mouth as needed., Disp: , Rfl:    cetirizine (ZYRTEC) 10 MG tablet, TAKE 1 TABLET(10 MG) BY MOUTH DAILY, Disp: 90 tablet, Rfl: 1   fluconazole (DIFLUCAN) 150 MG tablet, Take 1 tablet on day 4 of antibiotics.  Take second tablet 3 days later., Disp: 2 tablet, Rfl: 0   omeprazole (PRILOSEC) 40 MG capsule, Take 40 mg by mouth daily., Disp: , Rfl:    venlafaxine XR (EFFEXOR-XR) 150 MG 24 hr capsule, Take 150 mg by mouth every morning., Disp: , Rfl:    Allergies  Allergen Reactions   Pollen Extract Anaphylaxis   Dust Mite Mixed Allergen Ext [Mite (D. Farinae)]    Molds & Smuts     Past Medical History:  Diagnosis Date   ADHD (attention deficit hyperactivity disorder)    Anxiety    Asthma, mild intermittent    Carpal tunnel syndrome    Depression    GERD (gastroesophageal reflux  disease) 05/20/2013   PMDD (premenstrual dysphoric disorder)    PTSD (post-traumatic stress disorder)      Past Surgical History:  Procedure Laterality Date   COLONOSCOPY     UPPER GI ENDOSCOPY      Family History  Problem Relation Age of Onset   Bipolar disorder Father    Narcolepsy Father    Diabetes Mother    Bipolar disorder Sister    Narcolepsy Sister    Bipolar disorder Paternal Aunt     Social History   Tobacco Use   Smoking status: Never   Smokeless tobacco: Never  Vaping Use   Vaping Use: Never used  Substance Use Topics   Alcohol use: Yes    Comment: rarely   Drug use: Yes    ROS   Objective:   Vitals: BP 122/89   Pulse 80   Temp 99.1 F (37.3 C)   Resp 18   LMP 08/30/2021 (Approximate)   SpO2 98%   Physical Exam Constitutional:      General: She is not in acute distress.    Appearance: Normal appearance. She is well-developed. She is not ill-appearing, toxic-appearing or diaphoretic.  HENT:     Head: Normocephalic and atraumatic.     Nose: Nose normal.     Mouth/Throat:     Mouth: Mucous membranes are moist.  Eyes:     General: No scleral icterus.  Right eye: No discharge.        Left eye: No discharge.     Extraocular Movements: Extraocular movements intact.     Conjunctiva/sclera: Conjunctivae normal.  Cardiovascular:     Rate and Rhythm: Normal rate and regular rhythm.     Heart sounds: Normal heart sounds. No murmur heard.   No friction rub. No gallop.  Pulmonary:     Effort: Pulmonary effort is normal. No respiratory distress.     Breath sounds: No stridor. No wheezing, rhonchi or rales.  Chest:     Chest wall: No tenderness.  Abdominal:     General: Bowel sounds are increased. There is no distension.     Palpations: Abdomen is soft. There is no mass.     Tenderness: There is no abdominal tenderness. There is no right CVA tenderness, left CVA tenderness, guarding or rebound.  Skin:    General: Skin is warm and dry.   Neurological:     General: No focal deficit present.     Mental Status: She is alert and oriented to person, place, and time.  Psychiatric:        Mood and Affect: Mood normal.        Behavior: Behavior normal.        Thought Content: Thought content normal.        Judgment: Judgment normal.    Results for orders placed or performed during the hospital encounter of 09/20/21 (from the past 24 hour(s))  POCT urinalysis dipstick     Status: Abnormal   Collection Time: 09/20/21 10:01 AM  Result Value Ref Range   Color, UA yellow yellow   Clarity, UA clear clear   Glucose, UA negative negative mg/dL   Bilirubin, UA negative negative   Ketones, POC UA negative negative mg/dL   Spec Grav, UA 4.315 4.008 - 1.025   Blood, UA trace-intact (A) negative   pH, UA 5.5 5.0 - 8.0   Protein Ur, POC negative negative mg/dL   Urobilinogen, UA 0.2 0.2 or 1.0 E.U./dL   Nitrite, UA Negative Negative   Leukocytes, UA Trace (A) Negative  POCT urine pregnancy     Status: Normal   Collection Time: 09/20/21 10:01 AM  Result Value Ref Range   Preg Test, Ur Negative Negative    Assessment and Plan :   PDMP not reviewed this encounter.  1. Colitis   2. Diarrhea, unspecified type    Will manage for suspected colitis with supportive care.  Recommended patient hydrate well, eat light meals and maintain electrolytes.  Will use Zofran and Imodium for nausea, vomiting and diarrhea.  Low risk factors for acute abdomen, C. difficile and therefore will defer further work-up.  Counseled patient on potential for adverse effects with medications prescribed/recommended today, ER and return-to-clinic precautions discussed, patient verbalized understanding.    Wallis Bamberg, PA-C 09/20/21 1037

## 2021-09-20 NOTE — Discharge Instructions (Addendum)

## 2021-09-20 NOTE — ED Notes (Signed)
Patient states on Monday at 10am she stated having diarrhea. Patient states she has Bowel Movements every hour, took some imodium last night at 10 and slept through the night but diarrhea stated this morning again at 8am. Slight Nausea which patient sates is normal for her.  

## 2021-09-20 NOTE — ED Triage Notes (Signed)
Patient states on Monday at 10am she stated having diarrhea. Patient states she has Bowel Movements every hour, took some imodium last night at 10 and slept through the night but diarrhea stated this morning again at 8am. Slight Nausea which patient sates is normal for her.

## 2021-09-26 ENCOUNTER — Other Ambulatory Visit: Payer: Self-pay

## 2021-09-26 ENCOUNTER — Encounter: Payer: Self-pay | Admitting: Obstetrics and Gynecology

## 2021-09-26 DIAGNOSIS — Z3041 Encounter for surveillance of contraceptive pills: Secondary | ICD-10-CM

## 2021-09-26 MED ORDER — LO LOESTRIN FE 1 MG-10 MCG / 10 MCG PO TABS: 1.0000 | ORAL_TABLET | Freq: Every day | ORAL | 3 refills | Status: DC

## 2021-10-30 DIAGNOSIS — F902 Attention-deficit hyperactivity disorder, combined type: Secondary | ICD-10-CM | POA: Diagnosis not present

## 2021-10-30 DIAGNOSIS — R69 Illness, unspecified: Secondary | ICD-10-CM | POA: Diagnosis not present

## 2021-10-30 DIAGNOSIS — F411 Generalized anxiety disorder: Secondary | ICD-10-CM | POA: Diagnosis not present

## 2021-11-22 ENCOUNTER — Ambulatory Visit (INDEPENDENT_AMBULATORY_CARE_PROVIDER_SITE_OTHER): Payer: Worker's Compensation | Admitting: Adult Health

## 2021-11-22 ENCOUNTER — Encounter: Payer: Self-pay | Admitting: Adult Health

## 2021-11-22 VITALS — BP 100/70 | HR 99 | Temp 99.0°F | Ht 66.0 in | Wt 215.0 lb

## 2021-11-22 DIAGNOSIS — S66912A Strain of unspecified muscle, fascia and tendon at wrist and hand level, left hand, initial encounter: Secondary | ICD-10-CM | POA: Diagnosis not present

## 2021-11-22 DIAGNOSIS — S66911A Strain of unspecified muscle, fascia and tendon at wrist and hand level, right hand, initial encounter: Secondary | ICD-10-CM

## 2021-11-22 MED ORDER — NAPROXEN 500 MG PO TABS: 500.0000 mg | ORAL_TABLET | Freq: Two times a day (BID) | ORAL | 0 refills | Status: DC

## 2021-11-22 NOTE — Progress Notes (Signed)
Subjective:    Patient ID: Robin Hoover, female    DOB: 2000/12/29, 21 y.o.   MRN: [id number]  HPI 21 year old female who  has a past medical history of ADHD (attention deficit hyperactivity disorder), Anxiety, Asthma, mild intermittent, Carpal tunnel syndrome, Depression, GERD (gastroesophageal reflux disease) (05/20/2013), PMDD (premenstrual dysphoric disorder), and PTSD (post-traumatic stress disorder).  She has been evaluated today for an acute issue.  She has bilateral wrist pain that has been present for the last 2 weeks.  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  Pain is located on the dorsal aspect of the ulnar side of her hand, wrist, and forearm.  Pain seems to be more apparent with downward flexion.  She has no loss of range of motion.  At home she has been using an Ace bandage and Advil with some relief.  Review of Systems See HPI   Past Medical History:  Diagnosis Date   ADHD (attention deficit hyperactivity disorder)    Anxiety    Asthma, mild intermittent    Carpal tunnel syndrome    Depression    GERD (gastroesophageal reflux disease) 05/20/2013   PMDD (premenstrual dysphoric disorder)    PTSD (post-traumatic stress disorder)     Social History   Socioeconomic History   Marital status: Single    Spouse name: Not on file   Number of children: Not on file   Years of education: Not on file   Highest education level: Not on file  Occupational History   Not on file  Tobacco Use   Smoking status: Never   Smokeless tobacco: Never  Vaping Use   Vaping Use: Never used  Substance and Sexual Activity   Alcohol use: Yes    Comment: rarely   Drug use: Yes   Sexual activity: Yes  Other Topics Concern   Not on file  Social History Narrative   She likes to sleep, watch tv   Social Determinants of Health   Financial Resource Strain: Not on file  Food Insecurity: Food Insecurity Present (10/10/2020)   Hunger  Vital Sign    Worried About Running Out of Food in the Last Year: Sometimes true    Ran Out of Food in the Last Year: Sometimes true  Transportation Needs: No Transportation Needs (10/10/2020)   PRAPARE - Administrator, Civil Service (Medical): No    Lack of Transportation (Non-Medical): No  Physical Activity: Not on file  Stress: Not on file  Social Connections: Not on file  Intimate Partner Violence: Not on file    Past Surgical History:  Procedure Laterality Date   COLONOSCOPY     UPPER GI ENDOSCOPY      Family History  Problem Relation Age of Onset   Bipolar disorder Father    Narcolepsy Father    Diabetes Mother    Bipolar disorder Sister    Narcolepsy Sister    Bipolar disorder Paternal Aunt     Allergies  Allergen Reactions   Pollen Extract Anaphylaxis   Dust Mite Mixed Allergen Ext [Mite (D. Farinae)]    Molds & Smuts     Current Outpatient Medications on File Prior to Visit  Medication Sig Dispense Refill   albuterol (VENTOLIN HFA) 108 (90 Base) MCG/ACT inhaler Inhale 1-2 puffs into the lungs as directed. 6.7 g 2   atomoxetine (STRATTERA) 40 MG capsule Take 25 mg by mouth every morning.  busPIRone (BUSPAR) 10 MG tablet Take 10 mg by mouth as needed.     cetirizine (ZYRTEC) 10 MG tablet TAKE 1 TABLET(10 MG) BY MOUTH DAILY 90 tablet 1   Norethindrone-Ethinyl Estradiol-Fe Biphas (LO LOESTRIN FE) 1 MG-10 MCG / 10 MCG tablet Take 1 tablet by mouth daily. 28 tablet 3   omeprazole (PRILOSEC) 40 MG capsule TAKE 1 CAPSULE(40 MG) BY MOUTH DAILY 90 capsule 0   venlafaxine XR (EFFEXOR-XR) 150 MG 24 hr capsule Take 150 mg by mouth every morning.     No current facility-administered medications on file prior to visit.    BP 100/70   Pulse 99   Temp 99 F (37.2 C) (Oral)   Ht 5\' 6"  (1.676 m)   Wt 215 lb (97.5 kg)   SpO2 99%   BMI 34.70 kg/m       Objective:   Physical Exam Vitals and nursing note reviewed.  Constitutional:      Appearance:  Normal appearance.  Musculoskeletal:        General: Tenderness present. No swelling or deformity.     Right wrist: Normal. No tenderness or bony tenderness. Normal range of motion.     Left wrist: Tenderness (Mild tenderness along the extensor carpi ulnar wrist tendon) present. Normal range of motion.  Skin:    General: Skin is warm and dry.     Capillary Refill: Capillary refill takes less than 2 seconds.  Neurological:     General: No focal deficit present.     Mental Status: She is alert and oriented to person, place, and time.  Psychiatric:        Mood and Affect: Mood normal.        Behavior: Behavior normal.        Thought Content: Thought content normal.        Judgment: Judgment normal.       Assessment & Plan:  1. Strain of both wrists, initial encounter -Likely tendinitis.  Advised ice, rest, and anti-inflammatories.  Follow-up if no improvement in the next 2 weeks.  She was advised to take anti-inflammatories with food. - naproxen (NAPROSYN) 500 MG tablet; Take 1 tablet (500 mg total) by mouth 2 (two) times daily with a meal.  Dispense: 30 tablet; Refill: 0  , NP

## 2021-11-22 NOTE — Patient Instructions (Signed)
Health Maintenance Due  Topic Date Due   HIV Screening  Never done   Hepatitis C Screening  Never done   COVID-19 Vaccine (3 - Pfizer series) 10/10/2019   TETANUS/TDAP  11/30/2020   INFLUENZA VACCINE  11/21/2021      Row Labels 11/22/2021    1:59 PM 03/21/2021    2:10 PM 10/10/2020    3:40 PM  Depression screen PHQ 2/9   Section Header. No data exists in this row.     Decreased Interest   0 2 1  Down, Depressed, Hopeless   1 1 0  PHQ - 2 Score   1 3 1   Altered sleeping   3 2 1   Tired, decreased energy   3 2 1   Change in appetite   1 3 2   Feeling bad or failure about yourself    1 2 2   Trouble concentrating   1 1 1   Moving slowly or fidgety/restless   0 1 2  Suicidal thoughts   1 1 0  PHQ-9 Score   11 15 10   Difficult doing work/chores   Not difficult at all  Not difficult at all

## 2021-12-23 ENCOUNTER — Other Ambulatory Visit: Payer: Self-pay | Admitting: Adult Health

## 2022-02-15 ENCOUNTER — Ambulatory Visit (INDEPENDENT_AMBULATORY_CARE_PROVIDER_SITE_OTHER): Payer: 59 | Admitting: Adult Health

## 2022-02-15 ENCOUNTER — Encounter: Payer: Self-pay | Admitting: Adult Health

## 2022-02-15 VITALS — BP 108/70 | HR 82 | Temp 98.4°F | Ht 66.0 in | Wt 218.0 lb

## 2022-02-15 DIAGNOSIS — Z0000 Encounter for general adult medical examination without abnormal findings: Secondary | ICD-10-CM

## 2022-02-15 DIAGNOSIS — J0140 Acute pansinusitis, unspecified: Secondary | ICD-10-CM | POA: Diagnosis not present

## 2022-02-15 DIAGNOSIS — F332 Major depressive disorder, recurrent severe without psychotic features: Secondary | ICD-10-CM | POA: Diagnosis not present

## 2022-02-15 DIAGNOSIS — J988 Other specified respiratory disorders: Secondary | ICD-10-CM | POA: Diagnosis not present

## 2022-02-15 DIAGNOSIS — Z23 Encounter for immunization: Secondary | ICD-10-CM | POA: Diagnosis not present

## 2022-02-15 DIAGNOSIS — F411 Generalized anxiety disorder: Secondary | ICD-10-CM

## 2022-02-15 DIAGNOSIS — K219 Gastro-esophageal reflux disease without esophagitis: Secondary | ICD-10-CM | POA: Diagnosis not present

## 2022-02-15 DIAGNOSIS — Z1159 Encounter for screening for other viral diseases: Secondary | ICD-10-CM

## 2022-02-15 DIAGNOSIS — F902 Attention-deficit hyperactivity disorder, combined type: Secondary | ICD-10-CM

## 2022-02-15 DIAGNOSIS — R69 Illness, unspecified: Secondary | ICD-10-CM | POA: Diagnosis not present

## 2022-02-15 DIAGNOSIS — R5383 Other fatigue: Secondary | ICD-10-CM | POA: Diagnosis not present

## 2022-02-15 DIAGNOSIS — Z114 Encounter for screening for human immunodeficiency virus [HIV]: Secondary | ICD-10-CM

## 2022-02-15 LAB — CBC WITH DIFFERENTIAL/PLATELET
Basophils Absolute: 0 10*3/uL (ref 0.0–0.1)
Basophils Relative: 0.4 % (ref 0.0–3.0)
Eosinophils Absolute: 0.2 10*3/uL (ref 0.0–0.7)
Eosinophils Relative: 1.8 % (ref 0.0–5.0)
HCT: 41.1 % (ref 36.0–46.0)
Hemoglobin: 13.7 g/dL (ref 12.0–15.0)
Lymphocytes Relative: 20.2 % (ref 12.0–46.0)
Lymphs Abs: 2.1 10*3/uL (ref 0.7–4.0)
MCHC: 33.4 g/dL (ref 30.0–36.0)
MCV: 81 fl (ref 78.0–100.0)
Monocytes Absolute: 0.7 10*3/uL (ref 0.1–1.0)
Monocytes Relative: 7 % (ref 3.0–12.0)
Neutro Abs: 7.3 10*3/uL (ref 1.4–7.7)
Neutrophils Relative %: 70.6 % (ref 43.0–77.0)
Platelets: 343 10*3/uL (ref 150.0–400.0)
RBC: 5.07 Mil/uL (ref 3.87–5.11)
RDW: 14.1 % (ref 11.5–15.5)
WBC: 10.4 10*3/uL (ref 4.0–10.5)

## 2022-02-15 LAB — LIPID PANEL
Cholesterol: 130 mg/dL (ref 0–200)
HDL: 43.1 mg/dL (ref >39.00)
LDL Cholesterol: 66 mg/dL (ref 0–99)
NonHDL: 86.84
Total CHOL/HDL Ratio: 3
Triglycerides: 105 mg/dL (ref 0.0–149.0)
VLDL: 21 mg/dL (ref 0.0–40.0)

## 2022-02-15 LAB — HEMOGLOBIN A1C: Hgb A1c MFr Bld: 5.6 % (ref 4.6–6.5)

## 2022-02-15 LAB — COMPREHENSIVE METABOLIC PANEL
ALT: 18 U/L (ref 0–35)
AST: 19 U/L (ref 0–37)
Albumin: 3.9 g/dL (ref 3.5–5.2)
Alkaline Phosphatase: 81 U/L (ref 39–117)
BUN: 10 mg/dL (ref 6–23)
CO2: 28 mEq/L (ref 19–32)
Calcium: 8.9 mg/dL (ref 8.4–10.5)
Chloride: 103 mEq/L (ref 96–112)
Creatinine, Ser: 0.63 mg/dL (ref 0.40–1.20)
GFR: 125.64 mL/min (ref >60.00)
Glucose, Bld: 95 mg/dL (ref 70–99)
Potassium: 4.3 mEq/L (ref 3.5–5.1)
Sodium: 139 mEq/L (ref 135–145)
Total Bilirubin: 0.4 mg/dL (ref 0.2–1.2)
Total Protein: 7.2 g/dL (ref 6.0–8.3)

## 2022-02-15 LAB — VITAMIN B12: Vitamin B-12: 363 pg/mL (ref 211–911)

## 2022-02-15 LAB — IBC + FERRITIN
Ferritin: 31.3 ng/mL (ref 10.0–291.0)
Iron: 69 ug/dL (ref 42–145)
Saturation Ratios: 22.2 % (ref 20.0–50.0)
TIBC: 310.8 ug/dL (ref 250.0–450.0)
Transferrin: 222 mg/dL (ref 212.0–360.0)

## 2022-02-15 LAB — VITAMIN D 25 HYDROXY (VIT D DEFICIENCY, FRACTURES): VITD: 22.04 ng/mL — ABNORMAL LOW (ref 30.00–100.00)

## 2022-02-15 LAB — TSH: TSH: 1.53 u[IU]/mL (ref 0.35–5.50)

## 2022-02-15 MED ORDER — DOXYCYCLINE HYCLATE 100 MG PO CAPS: 100.0000 mg | ORAL_CAPSULE | Freq: Two times a day (BID) | ORAL | 0 refills | Status: DC

## 2022-02-15 MED ORDER — PREDNISONE 10 MG PO TABS: ORAL_TABLET | ORAL | 0 refills | Status: DC

## 2022-02-15 MED ORDER — ALBUTEROL SULFATE HFA 108 (90 BASE) MCG/ACT IN AERS: 1.0000 | INHALATION_SPRAY | RESPIRATORY_TRACT | 2 refills | Status: AC

## 2022-02-15 NOTE — Progress Notes (Signed)
Subjective:    Patient ID: Robin Hoover, female    DOB: 2001/01/29, 21 y.o.   MRN: [id number]  HPI Patient presents for yearly preventative medicine examination. She is a pleasant 21 year old female who  has a past medical history of ADHD (attention deficit hyperactivity disorder), Anxiety, Asthma, mild intermittent, Carpal tunnel syndrome, Depression, GERD (gastroesophageal reflux disease) (05/20/2013), PMDD (premenstrual dysphoric disorder), and PTSD (post-traumatic stress disorder).  ADHD -managed by psychiatry.  Currently prescribed Strattera 40 mg daily.  Depression/Anxiety -managed by psychiatry.  Currently managed with BuSpar 10 mg and 10 mg at night PRN and Effexor 150 mg daily.  GERD-controlled with Prilosec 40 mg  Generalized Fatigue -she reports that her psychiatrist wanted her to be screened with basic labs for causes that may be seen chronic fatigue syndrome before deciding on if it is her psychiatry medication. She does have mild sleep apnea diagnosed in 2021. Symptoms seem to be getting worse over the last few months.   Acute Issue She reports that over the last 7 to 10 days she has been experiencing sinus pain and pressure, sinus drainage, sinus headaches, a cough that is worse at night that is accompanied by wheezing and shortness of breath.  She denies fevers, or chills.  Symptoms have not been improving over the last week  All immunizations and health maintenance protocols were reviewed with the patient and needed orders were placed.  Appropriate screening laboratory values were ordered for the patient including screening of hyperlipidemia, renal function and hepatic function.  Medication reconciliation,  past medical history, social history, problem list and allergies were reviewed in detail with the patient  Goals were established with regard to weight loss, exercise, and  diet in compliance with medications. She is actively working on weight loss though  diet and exercise.   Wt Readings from Last 3 Encounters:  11/22/21 215 lb (97.5 kg)  07/12/21 228 lb (103.4 kg)  07/11/21 225 lb (102.1 kg)   Review of Systems  Constitutional:  Positive for fatigue.  HENT:  Positive for congestion, rhinorrhea, sinus pressure and sinus pain.   Eyes: Negative.   Respiratory:  Positive for cough, shortness of breath and wheezing.   Cardiovascular: Negative.   Gastrointestinal: Negative.   Endocrine: Negative.   Genitourinary: Negative.   Musculoskeletal: Negative.   Skin: Negative.   Allergic/Immunologic: Negative.   Neurological: Negative.   Hematological: Negative.   Psychiatric/Behavioral: Negative.     Past Medical History:  Diagnosis Date   ADHD (attention deficit hyperactivity disorder)    Anxiety    Asthma, mild intermittent    Carpal tunnel syndrome    Depression    GERD (gastroesophageal reflux disease) 05/20/2013   PMDD (premenstrual dysphoric disorder)    PTSD (post-traumatic stress disorder)     Social History   Socioeconomic History   Marital status: Single    Spouse name: Not on file   Number of children: Not on file   Years of education: Not on file   Highest education level: Not on file  Occupational History   Not on file  Tobacco Use   Smoking status: Never   Smokeless tobacco: Never  Vaping Use   Vaping Use: Never used  Substance and Sexual Activity   Alcohol use: Yes    Comment: rarely   Drug use: Yes   Sexual activity: Yes  Other Topics Concern   Not on file  Social History Narrative   She likes to sleep, watch tv  Social Determinants of Health   Financial Resource Strain: Not on file  Food Insecurity: Food Insecurity Present (10/10/2020)   Hunger Vital Sign    Worried About Running Out of Food in the Last Year: Sometimes true    Ran Out of Food in the Last Year: Sometimes true  Transportation Needs: No Transportation Needs (10/10/2020)   PRAPARE - Administrator, Civil Service  (Medical): No    Lack of Transportation (Non-Medical): No  Physical Activity: Not on file  Stress: Not on file  Social Connections: Not on file  Intimate Partner Violence: Not on file    Past Surgical History:  Procedure Laterality Date   COLONOSCOPY     UPPER GI ENDOSCOPY      Family History  Problem Relation Age of Onset   Bipolar disorder Father    Narcolepsy Father    Diabetes Mother    Bipolar disorder Sister    Narcolepsy Sister    Bipolar disorder Paternal Aunt     Allergies  Allergen Reactions   Pollen Extract Anaphylaxis   Dust Mite Mixed Allergen Ext [Mite (D. Farinae)]    Molds & Smuts     Current Outpatient Medications on File Prior to Visit  Medication Sig Dispense Refill   albuterol (VENTOLIN HFA) 108 (90 Base) MCG/ACT inhaler Inhale 1-2 puffs into the lungs as directed. 6.7 g 2   atomoxetine (STRATTERA) 40 MG capsule Take 25 mg by mouth every morning.     busPIRone (BUSPAR) 10 MG tablet Take 10 mg by mouth as needed.     cetirizine (ZYRTEC) 10 MG tablet TAKE 1 TABLET(10 MG) BY MOUTH DAILY 90 tablet 1   naproxen (NAPROSYN) 500 MG tablet Take 1 tablet (500 mg total) by mouth 2 (two) times daily with a meal. 30 tablet 0   Norethindrone-Ethinyl Estradiol-Fe Biphas (LO LOESTRIN FE) 1 MG-10 MCG / 10 MCG tablet Take 1 tablet by mouth daily. 28 tablet 3   omeprazole (PRILOSEC) 40 MG capsule TAKE 1 CAPSULE(40 MG) BY MOUTH DAILY 90 capsule 0   venlafaxine XR (EFFEXOR-XR) 150 MG 24 hr capsule Take 150 mg by mouth every morning.     No current facility-administered medications on file prior to visit.    There were no vitals taken for this visit.      Objective:   Physical Exam Vitals and nursing note reviewed.  Constitutional:      General: She is not in acute distress.    Appearance: Normal appearance. She is well-developed. She is obese. She is not ill-appearing.  HENT:     Head: Normocephalic and atraumatic.     Right Ear: Ear canal and external ear  normal. A middle ear effusion is present. There is no impacted cerumen. Tympanic membrane is not erythematous.     Left Ear: Ear canal and external ear normal. A middle ear effusion is present. There is no impacted cerumen. Tympanic membrane is not erythematous.     Nose: Congestion and rhinorrhea present. Rhinorrhea is purulent.     Right Turbinates: Enlarged and swollen.     Left Turbinates: Enlarged and swollen.     Right Sinus: Maxillary sinus tenderness and frontal sinus tenderness present.     Left Sinus: Maxillary sinus tenderness and frontal sinus tenderness present.     Mouth/Throat:     Mouth: Mucous membranes are moist.     Pharynx: Oropharynx is clear. No oropharyngeal exudate or posterior oropharyngeal erythema.  Eyes:     General:  Right eye: No discharge.        Left eye: No discharge.     Extraocular Movements: Extraocular movements intact.     Conjunctiva/sclera: Conjunctivae normal.     Pupils: Pupils are equal, round, and reactive to light.  Neck:     Thyroid: No thyromegaly.     Vascular: No carotid bruit.     Trachea: No tracheal deviation.  Cardiovascular:     Rate and Rhythm: Normal rate and regular rhythm.     Pulses: Normal pulses.     Heart sounds: Normal heart sounds. No murmur heard.    No friction rub. No gallop.  Pulmonary:     Effort: Pulmonary effort is normal. No respiratory distress.     Breath sounds: No stridor. Examination of the right-upper field reveals wheezing. Examination of the left-upper field reveals wheezing. Examination of the right-middle field reveals wheezing. Examination of the left-middle field reveals wheezing. Examination of the right-lower field reveals wheezing. Examination of the left-lower field reveals wheezing. Wheezing (expiratory wheezing throughout) present. No rhonchi or rales.  Chest:     Chest wall: No tenderness.  Abdominal:     General: Abdomen is flat. Bowel sounds are normal. There is no distension.      Palpations: Abdomen is soft. There is no mass.     Tenderness: There is no abdominal tenderness. There is no right CVA tenderness, left CVA tenderness, guarding or rebound.     Hernia: No hernia is present.  Musculoskeletal:        General: No swelling, tenderness, deformity or signs of injury. Normal range of motion.     Cervical back: Normal range of motion and neck supple.     Right lower leg: No edema.     Left lower leg: No edema.  Lymphadenopathy:     Cervical: No cervical adenopathy.  Skin:    General: Skin is warm and dry.     Coloration: Skin is not jaundiced or pale.     Findings: No bruising, erythema, lesion or rash.  Neurological:     General: No focal deficit present.     Mental Status: She is alert and oriented to person, place, and time.     Cranial Nerves: No cranial nerve deficit.     Sensory: No sensory deficit.     Motor: No weakness.     Coordination: Coordination normal.     Gait: Gait normal.     Deep Tendon Reflexes: Reflexes normal.  Psychiatric:        Mood and Affect: Mood normal.        Behavior: Behavior normal.        Thought Content: Thought content normal.        Judgment: Judgment normal.       Assessment & Plan:  1. Routine general medical examination at a health care facility -She is doing with weight loss.  Encouraged to continue exercising and eating healthy -Follow-up in 1 year or sooner if needed - CBC with Differential/Platelet; Future - Comprehensive metabolic panel; Future - Hemoglobin A1c; Future - Lipid panel; Future - TSH; Future  2. Gastroesophageal reflux disease, unspecified whether esophagitis present - Continue PPI  - CBC with Differential/Platelet; Future - Comprehensive metabolic panel; Future - Hemoglobin A1c; Future - Lipid panel; Future - TSH; Future  3. Generalized anxiety disorder - per psychiatry   4. Severe episode of recurrent major depressive disorder, without psychotic features ([place]) - Per psychiatry    5. ADHD (attention  deficit hyperactivity disorder), combined type - Per psychiatry   6. Other fatigue  - CBC with Differential/Platelet; Future - Comprehensive metabolic panel; Future - Hemoglobin A1c; Future - Lipid panel; Future - TSH; Future - VITAMIN D 25 Hydroxy (Vit-D Deficiency, Fractures); Future - Vitamin B12; Future - IBC + Ferritin; Future  7. Encounter for screening for HIV  - Hep C Antibody; Future  8. Need for hepatitis C screening test  - HIV Antibody (routine testing w rflx); Future  9. Acute non-recurrent pansinusitis  - doxycycline (VIBRAMYCIN) 100 MG capsule; Take 1 capsule (100 mg total) by mouth 2 (two) times daily.  Dispense: 14 capsule; Refill: 0  10. Respiratory infection -We will treat due to symptoms and duration. - doxycycline (VIBRAMYCIN) 100 MG capsule; Take 1 capsule (100 mg total) by mouth 2 (two) times daily.  Dispense: 14 capsule; Refill: 0 - predniSONE (DELTASONE) 10 MG tablet; 40 mg x 3 days, 20 mg x 3 days, 10 mg x 3 days  Dispense: 21 tablet; Refill: 0 - albuterol (VENTOLIN HFA) 108 (90 Base) MCG/ACT inhaler; Inhale 1-2 puffs into the lungs as directed.  Dispense: 6.7 g; Refill: 2  Shirline Frees, NP

## 2022-02-15 NOTE — Addendum Note (Signed)
Addended by: Gwenyth Ober R on: 02/15/2022 10:04 AM   Modules accepted: Orders

## 2022-02-16 LAB — HEPATITIS C ANTIBODY: Hepatitis C Ab: NONREACTIVE

## 2022-02-16 LAB — HIV ANTIBODY (ROUTINE TESTING W REFLEX): HIV 1&2 Ab, 4th Generation: NONREACTIVE

## 2022-03-26 ENCOUNTER — Other Ambulatory Visit: Payer: Self-pay | Admitting: Adult Health

## 2022-03-31 ENCOUNTER — Other Ambulatory Visit: Payer: Self-pay | Admitting: Adult Health

## 2022-05-01 DIAGNOSIS — R051 Acute cough: Secondary | ICD-10-CM | POA: Diagnosis not present

## 2022-05-01 DIAGNOSIS — R110 Nausea: Secondary | ICD-10-CM | POA: Diagnosis not present

## 2022-05-01 DIAGNOSIS — J069 Acute upper respiratory infection, unspecified: Secondary | ICD-10-CM | POA: Diagnosis not present

## 2022-06-12 ENCOUNTER — Telehealth: Payer: Self-pay | Admitting: Adult Health

## 2022-06-12 MED ORDER — VENLAFAXINE HCL ER 150 MG PO CP24: 150.0000 mg | ORAL_CAPSULE | Freq: Every morning | ORAL | 0 refills | Status: DC

## 2022-06-12 NOTE — Addendum Note (Signed)
Addended by: Gwenyth Ober R on: 06/12/2022 04:54 PM   Modules accepted: Orders

## 2022-06-12 NOTE — Telephone Encounter (Signed)
Rx refilled for 7 tablets

## 2022-06-12 NOTE — Telephone Encounter (Signed)
Patient requesting an emergency fill of her venlafaxine XR (EFFEXOR-XR) 150 MG 24 hr capsule , written by another provider (behavioral health). Says the provider has to see her prior to refilling and they can't get her in for another week. Requests a call

## 2022-06-12 NOTE — Telephone Encounter (Signed)
Please advise 

## 2022-06-12 NOTE — Telephone Encounter (Signed)
Patient notified of update  and verbalized understanding. 

## 2022-06-12 NOTE — Telephone Encounter (Signed)
Pt calling to check progress of refill.

## 2022-06-14 ENCOUNTER — Encounter: Payer: Self-pay | Admitting: Adult Health

## 2022-06-15 ENCOUNTER — Other Ambulatory Visit: Payer: Self-pay | Admitting: Adult Health

## 2022-06-15 DIAGNOSIS — F902 Attention-deficit hyperactivity disorder, combined type: Secondary | ICD-10-CM

## 2022-06-15 DIAGNOSIS — F332 Major depressive disorder, recurrent severe without psychotic features: Secondary | ICD-10-CM

## 2022-06-15 DIAGNOSIS — F411 Generalized anxiety disorder: Secondary | ICD-10-CM

## 2022-06-15 MED ORDER — VENLAFAXINE HCL ER 150 MG PO CP24: 150.0000 mg | ORAL_CAPSULE | Freq: Every morning | ORAL | 0 refills | Status: AC

## 2022-06-15 NOTE — Telephone Encounter (Signed)
Please advise 

## 2022-06-19 DIAGNOSIS — F411 Generalized anxiety disorder: Secondary | ICD-10-CM | POA: Diagnosis not present

## 2022-06-19 DIAGNOSIS — F902 Attention-deficit hyperactivity disorder, combined type: Secondary | ICD-10-CM | POA: Diagnosis not present

## 2022-06-19 DIAGNOSIS — R69 Illness, unspecified: Secondary | ICD-10-CM | POA: Diagnosis not present

## 2022-07-09 DIAGNOSIS — F411 Generalized anxiety disorder: Secondary | ICD-10-CM | POA: Diagnosis not present

## 2022-07-09 DIAGNOSIS — F321 Major depressive disorder, single episode, moderate: Secondary | ICD-10-CM | POA: Diagnosis not present

## 2022-07-09 DIAGNOSIS — F902 Attention-deficit hyperactivity disorder, combined type: Secondary | ICD-10-CM | POA: Diagnosis not present

## 2022-07-11 ENCOUNTER — Other Ambulatory Visit: Payer: Self-pay | Admitting: Adult Health

## 2022-07-13 ENCOUNTER — Ambulatory Visit (HOSPITAL_COMMUNITY): Payer: Self-pay | Admitting: Psychiatry

## 2022-08-06 DIAGNOSIS — F902 Attention-deficit hyperactivity disorder, combined type: Secondary | ICD-10-CM | POA: Diagnosis not present

## 2022-08-06 DIAGNOSIS — F411 Generalized anxiety disorder: Secondary | ICD-10-CM | POA: Diagnosis not present

## 2022-08-06 DIAGNOSIS — F321 Major depressive disorder, single episode, moderate: Secondary | ICD-10-CM | POA: Diagnosis not present

## 2022-09-03 DIAGNOSIS — F411 Generalized anxiety disorder: Secondary | ICD-10-CM | POA: Diagnosis not present

## 2022-09-03 DIAGNOSIS — F321 Major depressive disorder, single episode, moderate: Secondary | ICD-10-CM | POA: Diagnosis not present

## 2022-09-03 DIAGNOSIS — F902 Attention-deficit hyperactivity disorder, combined type: Secondary | ICD-10-CM | POA: Diagnosis not present

## 2022-10-01 DIAGNOSIS — F321 Major depressive disorder, single episode, moderate: Secondary | ICD-10-CM | POA: Diagnosis not present

## 2022-10-01 DIAGNOSIS — F902 Attention-deficit hyperactivity disorder, combined type: Secondary | ICD-10-CM | POA: Diagnosis not present

## 2022-10-01 DIAGNOSIS — F411 Generalized anxiety disorder: Secondary | ICD-10-CM | POA: Diagnosis not present

## 2022-10-09 ENCOUNTER — Other Ambulatory Visit: Payer: Self-pay | Admitting: Adult Health

## 2022-10-15 ENCOUNTER — Other Ambulatory Visit: Payer: Self-pay | Admitting: Adult Health

## 2022-11-26 DIAGNOSIS — F411 Generalized anxiety disorder: Secondary | ICD-10-CM | POA: Diagnosis not present

## 2022-11-26 DIAGNOSIS — F902 Attention-deficit hyperactivity disorder, combined type: Secondary | ICD-10-CM | POA: Diagnosis not present

## 2022-11-26 DIAGNOSIS — F321 Major depressive disorder, single episode, moderate: Secondary | ICD-10-CM | POA: Diagnosis not present

## 2023-01-19 ENCOUNTER — Other Ambulatory Visit: Payer: Self-pay | Admitting: Adult Health

## 2023-01-22 ENCOUNTER — Other Ambulatory Visit: Payer: Self-pay | Admitting: Adult Health

## 2023-01-22 NOTE — Telephone Encounter (Signed)
Pt needs a CPE for further refills 

## 2023-02-18 ENCOUNTER — Encounter: Payer: 59 | Admitting: Obstetrics and Gynecology

## 2023-02-21 ENCOUNTER — Encounter: Payer: Self-pay | Admitting: Obstetrics and Gynecology

## 2023-02-21 ENCOUNTER — Ambulatory Visit (INDEPENDENT_AMBULATORY_CARE_PROVIDER_SITE_OTHER): Payer: 59 | Admitting: Obstetrics and Gynecology

## 2023-02-21 VITALS — BP 122/74 | HR 84 | Ht 66.0 in | Wt 227.0 lb

## 2023-02-21 DIAGNOSIS — F3281 Premenstrual dysphoric disorder: Secondary | ICD-10-CM | POA: Diagnosis not present

## 2023-02-21 DIAGNOSIS — Z7289 Other problems related to lifestyle: Secondary | ICD-10-CM | POA: Insufficient documentation

## 2023-02-21 DIAGNOSIS — N898 Other specified noninflammatory disorders of vagina: Secondary | ICD-10-CM | POA: Diagnosis not present

## 2023-02-21 DIAGNOSIS — R45851 Suicidal ideations: Secondary | ICD-10-CM | POA: Insufficient documentation

## 2023-02-21 LAB — PREGNANCY, URINE: Preg Test, Ur: NEGATIVE

## 2023-02-21 LAB — WET PREP FOR TRICH, YEAST, CLUE

## 2023-02-21 MED ORDER — DROSPIRENONE-ETHINYL ESTRADIOL 3-0.02 MG PO TABS: 1.0000 | ORAL_TABLET | Freq: Every day | ORAL | 3 refills | Status: DC

## 2023-02-21 NOTE — Progress Notes (Deleted)
    22 y.o. G0P0000 female here for ***  No LMP recorded.    Birth control: *** Last mammogram: *** Sexually active: ***    GYN HISTORY: ***  OB History  Gravida Para Term Preterm AB Living  0 0 0 0 0 0  SAB IAB Ectopic Multiple Live Births  0 0 0 0 0    Past Medical History:  Diagnosis Date   ADHD (attention deficit hyperactivity disorder)    Anxiety    Asthma, mild intermittent    Carpal tunnel syndrome    Depression    GERD (gastroesophageal reflux disease) 05/20/2013   PMDD (premenstrual dysphoric disorder)    PTSD (post-traumatic stress disorder)     Past Surgical History:  Procedure Laterality Date   COLONOSCOPY     UPPER GI ENDOSCOPY      Current Outpatient Medications on File Prior to Visit  Medication Sig Dispense Refill   albuterol (VENTOLIN HFA) 108 (90 Base) MCG/ACT inhaler Inhale 1-2 puffs into the lungs as directed. 6.7 g 2   atomoxetine (STRATTERA) 40 MG capsule Take 25 mg by mouth every morning.     busPIRone (BUSPAR) 10 MG tablet Take 10 mg by mouth as needed.     cetirizine (ZYRTEC) 10 MG tablet TAKE 1 TABLET(10 MG) BY MOUTH DAILY 90 tablet 1   omeprazole (PRILOSEC) 40 MG capsule TAKE 1 CAPSULE(40 MG) BY MOUTH DAILY 30 capsule 0   venlafaxine XR (EFFEXOR-XR) 150 MG 24 hr capsule Take 1 capsule (150 mg total) by mouth every morning. 2 capsule 0   No current facility-administered medications on file prior to visit.    Allergies  Allergen Reactions   Pollen Extract Anaphylaxis   Dust Mite Mixed Allergen Ext [Mite (D. Farinae)]    Molds & Smuts       PE There were no vitals filed for this visit. There is no height or weight on file to calculate BMI.  Physical Exam    Assessment and Plan:        There are no diagnoses linked to this encounter.   Rosalyn Gess, MD

## 2023-02-21 NOTE — Progress Notes (Signed)
Robin Hoover  2000/08/12  MRN: [id number]   History: 22 y.o. G0P0000 with PMDD (dx 2018 by psych), ADHD presents for hormonal management. [employer], lives alone.  She reports a diagnosis of PMDD in 2018 at age 71 with worsening suicidal ideation and self injures behavior prior to her cycles. She has been off of oral contraceptive pills over the past 1 to 2 years.  During this time she has notes worsening mood symptoms. She denies active SI. She follows with psychiatry every 1 to 3 months.  She denies complications on prior COC. Not sexually active Smoker: no  Gynecologic History Patient's last menstrual period was 02/08/2023. Period Cycle (Days): 28 Period Duration (Days): 5-7 Period Pattern: Regular Menstrual Flow: Heavy Menstrual Control: Maxi pad Menstrual Control Change Freq (Hours): 4-6 Dysmenorrhea: (!) Severe Dysmenorrhea Symptoms: Cramping, Nausea, Headache  Obstetric History OB History  Gravida Para Term Preterm AB Living  0 0 0 0 0 0  SAB IAB Ectopic Multiple Live Births  0 0 0 0 0    The following portions of the patient's history were reviewed and updated as appropriate: allergies, current medications, past family history, past medical history, past social history, past surgical history, and problem list.  Exam:  Today's Vitals   02/21/23 0829  BP: 122/74  Pulse: 84  SpO2: 99%  Weight: 227 lb (103 kg)  Height: 5\' 6"  (1.676 m)   Body mass index is 36.64 kg/m.  Physical Exam Vitals reviewed. Exam conducted with a chaperone present.  Constitutional:      General: She is not in acute distress.    Appearance: Normal appearance.  HENT:     Head: Normocephalic and atraumatic.     Nose: Nose normal.  Eyes:     Extraocular Movements: Extraocular movements intact.     Conjunctiva/sclera: Conjunctivae normal.  Pulmonary:     Effort: Pulmonary effort is normal.  Genitourinary:    General: Normal vulva.     Exam position: Lithotomy  position.     Vagina: Normal. No vaginal discharge.     Cervix: Normal. No cervical motion tenderness, discharge or lesion.     Uterus: Normal. Not enlarged and not tender.      Adnexa: Right adnexa normal and left adnexa normal.  Musculoskeletal:        General: Normal range of motion.     Cervical back: Normal range of motion.  Neurological:     General: No focal deficit present.     Mental Status: She is alert.  Psychiatric:        Mood and Affect: Mood normal.        Behavior: Behavior normal.    Assessment/Plan:  PMDD (premenstrual dysphoric disorder) Assessment & Plan: Dx in 2018 by psychiatrist, notes suicidal ideation and self injurious behaviors prior to her cycle Sx moderately controlled on effexor and buspar, no recent self injury, no active SI Sees psych q1-3 month, continue Discussed that monophasic pills work best for PMDD. Drosperinone containing COC are FDA approved, 24+4. No contraindications, including hypertension, migraines with aura, smoking, and hx of VTE/DVT. Discussed side effects including break through bleeding, mood changes, weight changes, headache, breast tenderness, nausea, and bloating. Warning signs including vision changes and leg swelling reviewed. Recommend starting after next cycle. Return to office in 1 month for blood pressure check. All questions answered.   Orders: -     Pregnancy, urine -     Drospirenone-Ethinyl Estradiol; Take 1 tablet by mouth daily.  Dispense:  84 tablet; Refill: 3  Vaginal discharge -     WET PREP FOR TRICH, YEAST, CLUE    Rosalyn Gess, MD

## 2023-02-21 NOTE — Assessment & Plan Note (Addendum)
Dx in 2018 by psychiatrist, notes suicidal ideation and self injurious behaviors prior to her cycle Sx moderately controlled on effexor and buspar, no recent self injury, no active SI Sees psych q1-3 month, continue Discussed that monophasic pills work best for PMDD. Drosperinone containing COC are FDA approved, 24+4. No contraindications, including hypertension, migraines with aura, smoking, and hx of VTE/DVT. Discussed side effects including break through bleeding, mood changes, weight changes, headache, breast tenderness, nausea, and bloating. Warning signs including vision changes and leg swelling reviewed. Recommend starting after next cycle. Return to office in 1 month for blood pressure check. All questions answered.

## 2023-02-25 ENCOUNTER — Other Ambulatory Visit: Payer: Self-pay | Admitting: Adult Health

## 2023-02-25 DIAGNOSIS — F902 Attention-deficit hyperactivity disorder, combined type: Secondary | ICD-10-CM | POA: Diagnosis not present

## 2023-02-25 DIAGNOSIS — F411 Generalized anxiety disorder: Secondary | ICD-10-CM | POA: Diagnosis not present

## 2023-02-25 DIAGNOSIS — F321 Major depressive disorder, single episode, moderate: Secondary | ICD-10-CM | POA: Diagnosis not present

## 2023-02-26 ENCOUNTER — Encounter: Payer: Self-pay | Admitting: Adult Health

## 2023-02-26 ENCOUNTER — Ambulatory Visit: Payer: 59 | Admitting: Adult Health

## 2023-02-26 VITALS — BP 100/70 | HR 85 | Temp 98.1°F | Ht 66.0 in | Wt 226.0 lb

## 2023-02-26 DIAGNOSIS — R070 Pain in throat: Secondary | ICD-10-CM | POA: Diagnosis not present

## 2023-02-26 DIAGNOSIS — J020 Streptococcal pharyngitis: Secondary | ICD-10-CM | POA: Diagnosis not present

## 2023-02-26 DIAGNOSIS — R519 Headache, unspecified: Secondary | ICD-10-CM

## 2023-02-26 DIAGNOSIS — R5383 Other fatigue: Secondary | ICD-10-CM | POA: Diagnosis not present

## 2023-02-26 DIAGNOSIS — R0981 Nasal congestion: Secondary | ICD-10-CM | POA: Diagnosis not present

## 2023-02-26 DIAGNOSIS — R6889 Other general symptoms and signs: Secondary | ICD-10-CM

## 2023-02-26 LAB — POCT RAPID STREP A (OFFICE): Rapid Strep A Screen: POSITIVE — AB

## 2023-02-26 LAB — POC COVID19 BINAXNOW: SARS Coronavirus 2 Ag: NEGATIVE

## 2023-02-26 MED ORDER — AMOXICILLIN-POT CLAVULANATE 875-125 MG PO TABS: 1.0000 | ORAL_TABLET | Freq: Two times a day (BID) | ORAL | 0 refills | Status: DC

## 2023-02-26 MED ORDER — METHYLPREDNISOLONE 4 MG PO TBPK
ORAL_TABLET | ORAL | 0 refills | Status: DC
Start: 2023-02-26 — End: 2024-01-03

## 2023-02-26 NOTE — Progress Notes (Signed)
Subjective:    Patient ID: Robin Hoover, female    DOB: Sep 06, 2000, 22 y.o.   MRN: [id number]  HPI 22 year old female who  has a past medical history of ADHD (attention deficit hyperactivity disorder), Anxiety, Asthma, mild intermittent, Carpal tunnel syndrome, Depression, GERD (gastroesophageal reflux disease) (05/20/2013), PMDD (premenstrual dysphoric disorder), and PTSD (post-traumatic stress disorder).  She presents to the office today for an acute issue. She reports that over the last four days she has been experiencing nasal congestion, frontal headache, feeling of fluid in her ear, sore throat, feeling lethargic and fatigued. She has not had any fevers or chills.    Review of Systems See HPI   Past Medical History:  Diagnosis Date   ADHD (attention deficit hyperactivity disorder)    Anxiety    Asthma, mild intermittent    Carpal tunnel syndrome    Depression    GERD (gastroesophageal reflux disease) 05/20/2013   PMDD (premenstrual dysphoric disorder)    PTSD (post-traumatic stress disorder)     Social History   Socioeconomic History   Marital status: Single    Spouse name: Not on file   Number of children: Not on file   Years of education: Not on file   Highest education level: Not on file  Occupational History   Not on file  Tobacco Use   Smoking status: Never   Smokeless tobacco: Never  Vaping Use   Vaping status: Never Used  Substance and Sexual Activity   Alcohol use: Yes    Alcohol/week: 1.0 standard drink of alcohol    Types: 1 Shots of liquor per week   Drug use: Yes   Sexual activity: Not Currently    Birth control/protection: Abstinence  Other Topics Concern   Not on file  Social History Narrative   She likes to sleep, watch tv   Social Determinants of Health   Financial Resource Strain: Not on file  Food Insecurity: Food Insecurity Present (10/10/2020)   Hunger Vital Sign    Worried About Running Out of Food in the Last Year:  Sometimes true    Ran Out of Food in the Last Year: Sometimes true  Transportation Needs: No Transportation Needs (10/10/2020)   PRAPARE - Administrator, Civil Service (Medical): No    Lack of Transportation (Non-Medical): No  Physical Activity: Not on file  Stress: Not on file  Social Connections: Not on file  Intimate Partner Violence: Not on file    Past Surgical History:  Procedure Laterality Date   COLONOSCOPY     UPPER GI ENDOSCOPY      Family History  Problem Relation Age of Onset   Diabetes Mother    Bipolar disorder Father    Narcolepsy Father    Bipolar disorder Sister    Narcolepsy Sister    Bipolar disorder Paternal Aunt    Breast cancer Neg Hx    Ovarian cancer Neg Hx     Allergies  Allergen Reactions   Pollen Extract Anaphylaxis   Dust Mite Mixed Allergen Ext [Mite (D. Farinae)]    Molds & Smuts     Current Outpatient Medications on File Prior to Visit  Medication Sig Dispense Refill   albuterol (VENTOLIN HFA) 108 (90 Base) MCG/ACT inhaler Inhale 1-2 puffs into the lungs as directed. 6.7 g 2   atomoxetine (STRATTERA) 40 MG capsule Take 25 mg by mouth every morning.     busPIRone (BUSPAR) 10 MG tablet Take 10 mg by mouth  as needed.     cetirizine (ZYRTEC) 10 MG tablet TAKE 1 TABLET(10 MG) BY MOUTH DAILY 90 tablet 1   drospirenone-ethinyl estradiol (YAZ) 3-0.02 MG tablet Take 1 tablet by mouth daily. 84 tablet 3   omeprazole (PRILOSEC) 40 MG capsule TAKE 1 CAPSULE(40 MG) BY MOUTH DAILY 30 capsule 0   venlafaxine XR (EFFEXOR-XR) 150 MG 24 hr capsule Take 1 capsule (150 mg total) by mouth every morning. 2 capsule 0   No current facility-administered medications on file prior to visit.    BP 100/70   Pulse 85   Temp 98.1 F (36.7 C) (Oral)   Ht 5\' 6"  (1.676 m)   Wt 226 lb (102.5 kg)   LMP 02/08/2023   SpO2 100%   BMI 36.48 kg/m       Objective:   Physical Exam Vitals and nursing note reviewed.  Constitutional:      Appearance:  Normal appearance.  HENT:     Nose: No congestion or rhinorrhea.     Right Turbinates: Not enlarged or swollen.     Left Turbinates: Not enlarged or swollen.     Mouth/Throat:     Tonsils: Tonsillar exudate present. No tonsillar abscesses. 2+ on the right. 2+ on the left.  Cardiovascular:     Rate and Rhythm: Normal rate and regular rhythm.     Pulses: Normal pulses.     Heart sounds: Normal heart sounds.  Pulmonary:     Effort: Pulmonary effort is normal.     Breath sounds: Normal breath sounds.  Musculoskeletal:        General: Normal range of motion.  Lymphadenopathy:     Head:     Right side of head: Tonsillar adenopathy present.     Left side of head: Tonsillar adenopathy present.  Skin:    General: Skin is warm and dry.     Capillary Refill: Capillary refill takes less than 2 seconds.  Neurological:     General: No focal deficit present.     Mental Status: She is alert and oriented to person, place, and time.  Psychiatric:        Mood and Affect: Mood normal.        Behavior: Behavior normal.        Thought Content: Thought content normal.        Judgment: Judgment normal.        Assessment & Plan:  1. Flu-like symptoms  - POC COVID-19 BinaxNow- negative  - POCT rapid strep A - Positive   2. Strep sore throat  - amoxicillin-clavulanate (AUGMENTIN) 875-125 MG tablet; Take 1 tablet by mouth 2 (two) times daily.  Dispense: 20 tablet; Refill: 0 - methylPREDNISolone (MEDROL DOSEPAK) 4 MG TBPK tablet; Take as directed  Dispense: 21 tablet; Refill: 0  Shirline Frees, NP

## 2023-03-25 ENCOUNTER — Ambulatory Visit: Payer: 59

## 2023-03-25 ENCOUNTER — Ambulatory Visit: Payer: 59 | Admitting: Obstetrics and Gynecology

## 2023-04-29 ENCOUNTER — Ambulatory Visit (INDEPENDENT_AMBULATORY_CARE_PROVIDER_SITE_OTHER): Payer: 59

## 2023-04-29 VITALS — BP 110/72 | HR 81 | Wt 229.0 lb

## 2023-04-29 DIAGNOSIS — Z0130 Encounter for examination of blood pressure without abnormal findings: Secondary | ICD-10-CM

## 2023-04-29 NOTE — Progress Notes (Signed)
 Patient here for blood pressure check after starting Yaz. Patient states that she has been spotting for about 3 weeks. She states she would like to continue the [place]. Patient advised to call if bleeding continues.

## 2023-05-05 IMAGING — CT CT ABD-PELV W/ CM
2 of 4 series · 16 of 46 positions shown, 18 images · IV contrast (OMNIPAQUE 300)
Comparison: 10/20/2017

CLINICAL DATA: Abdominal pain

EXAM:
CT ABDOMEN AND PELVIS WITH CONTRAST
TECHNIQUE: Multidetector CT imaging of the abdomen and pelvis was performed
using the standard protocol following bolus administration of
intravenous contrast.

[Series 2: axial st · axial · 0.98mm/px · z∈[-408,+12]mm · 13 of 96 slices shown, 15 images]
[im 6/96  soft-tissue]
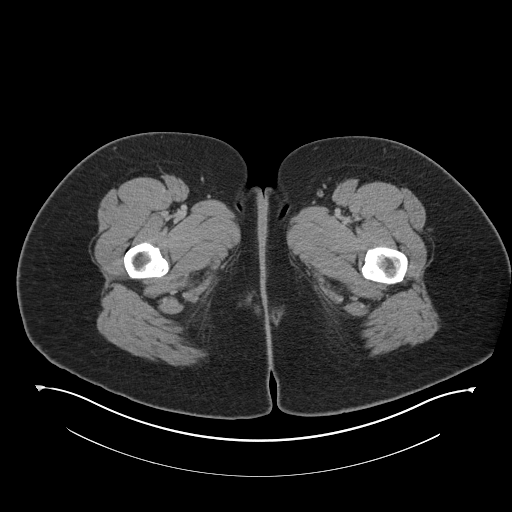
[im 6/96  bone]
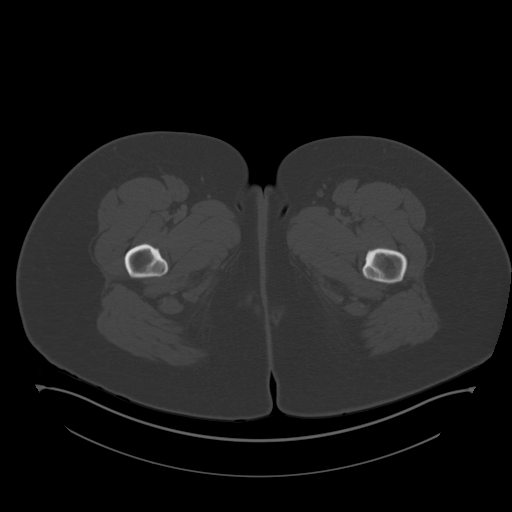
[im 11/96  soft-tissue]
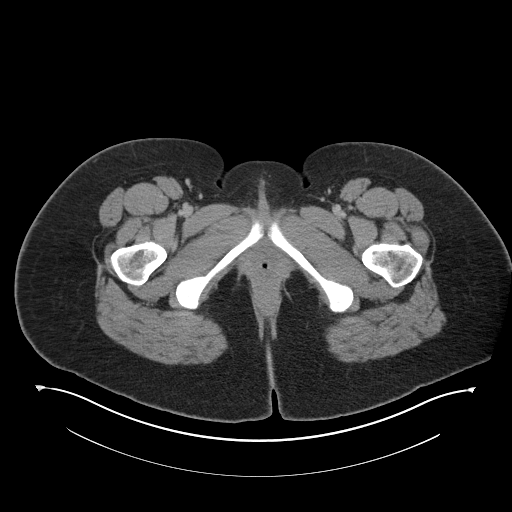
[im 22/96  soft-tissue]
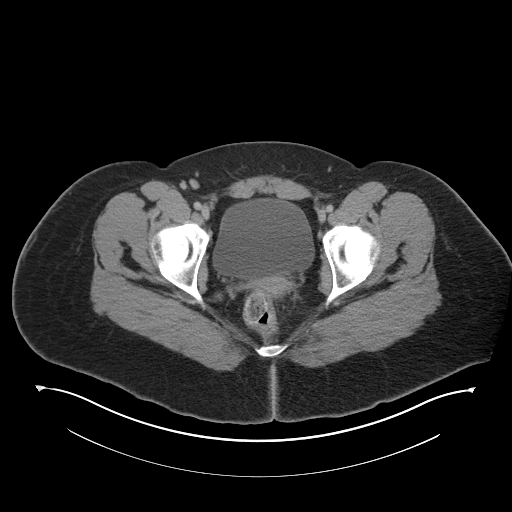
[im 27/96  soft-tissue]
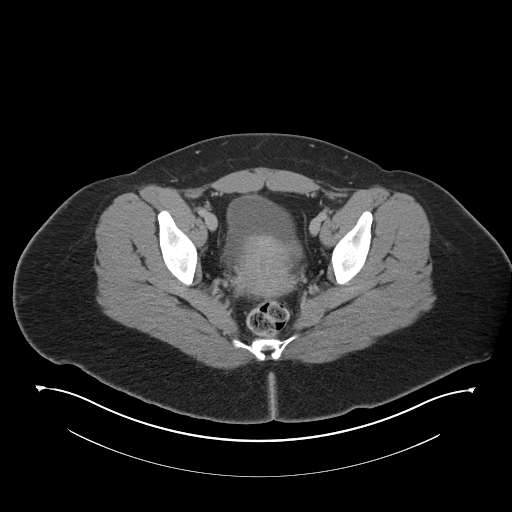
[im 32/96  soft-tissue]
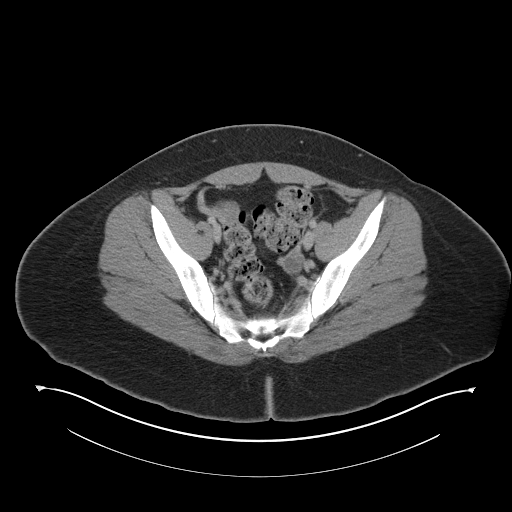
[im 43/96  soft-tissue]
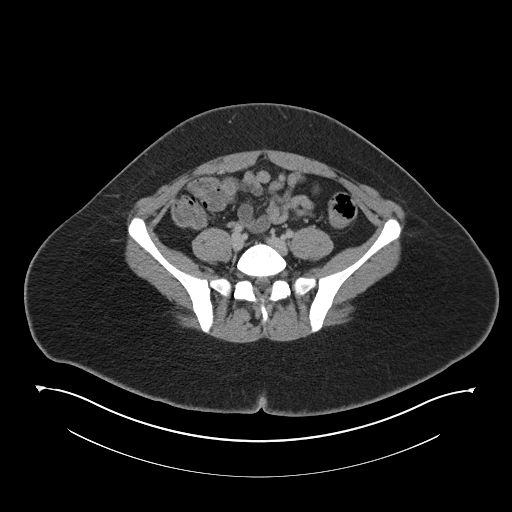
[im 48/96  soft-tissue]
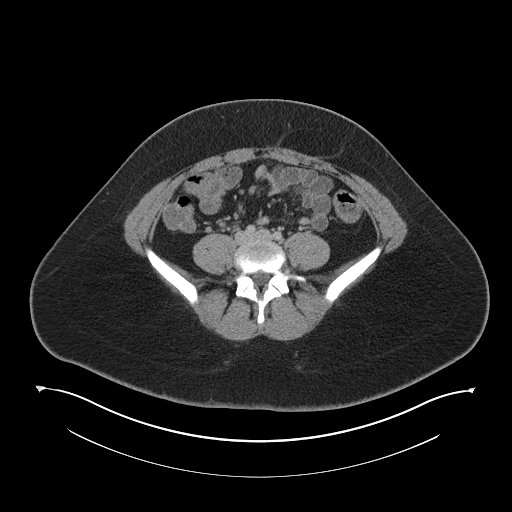
[im 53/96  soft-tissue]
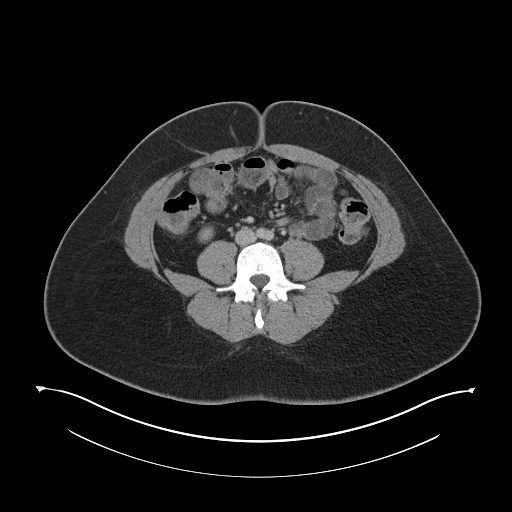
[im 64/96  soft-tissue]
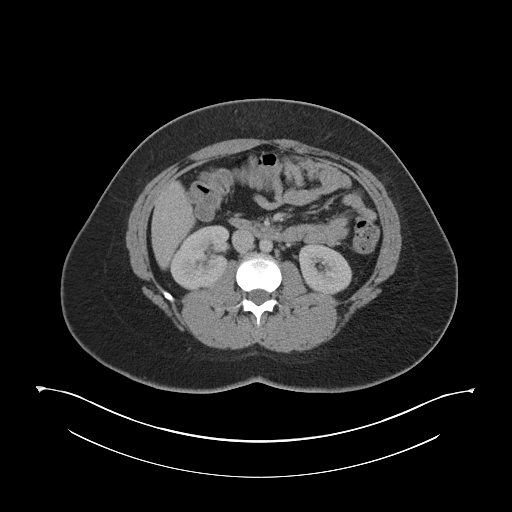
[im 64/96  bone]
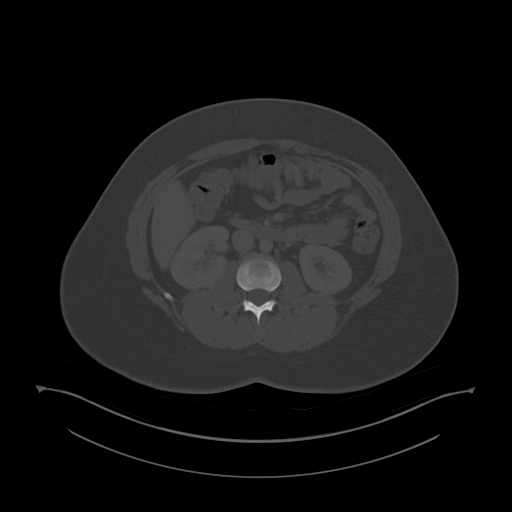
[im 69/96  soft-tissue]
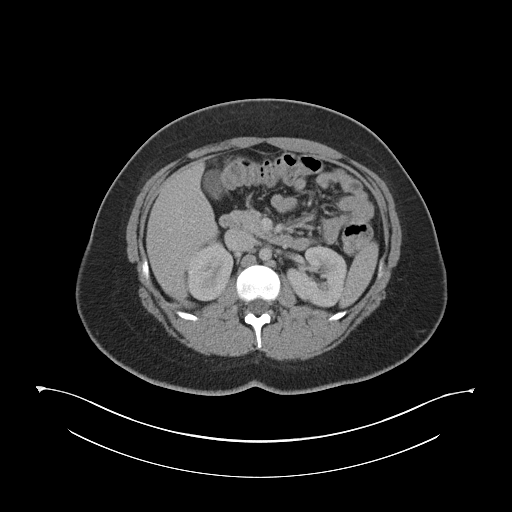
[im 74/96  soft-tissue]
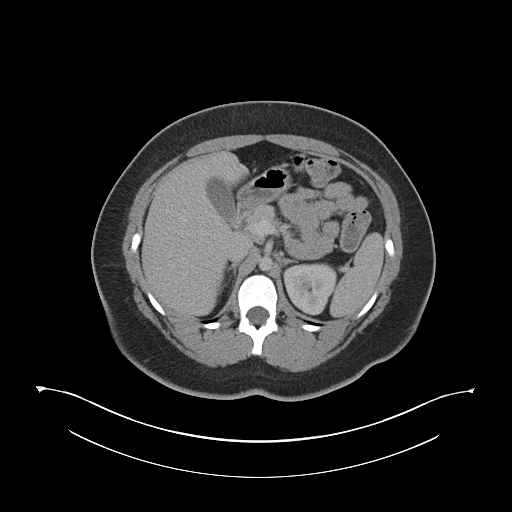
[im 85/96  soft-tissue]
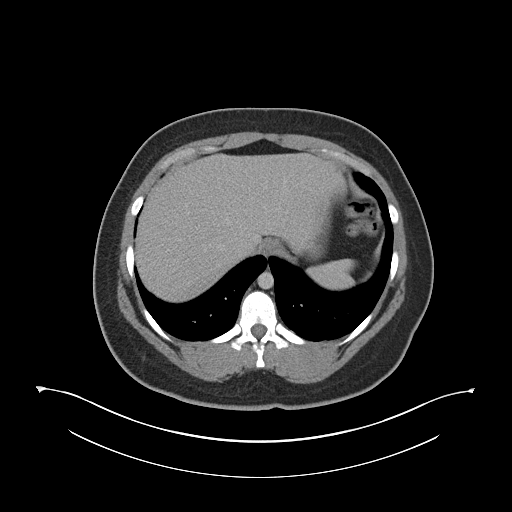
[im 90/96  soft-tissue]
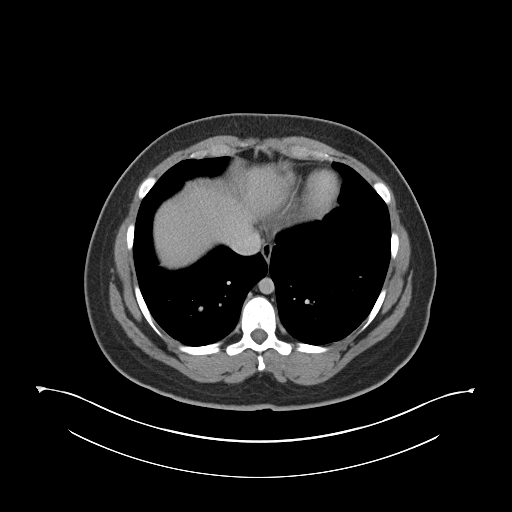

[Series 5: coronal st · coronal · 0.94mm/px · 3 of 172 slices shown]
[im 58/172  soft-tissue]
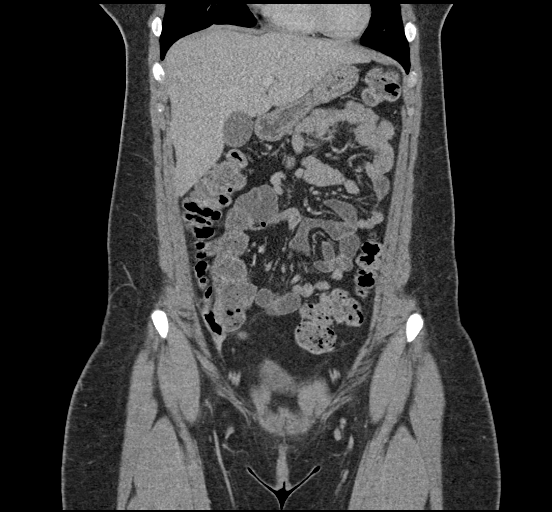
[im 77/172  soft-tissue]
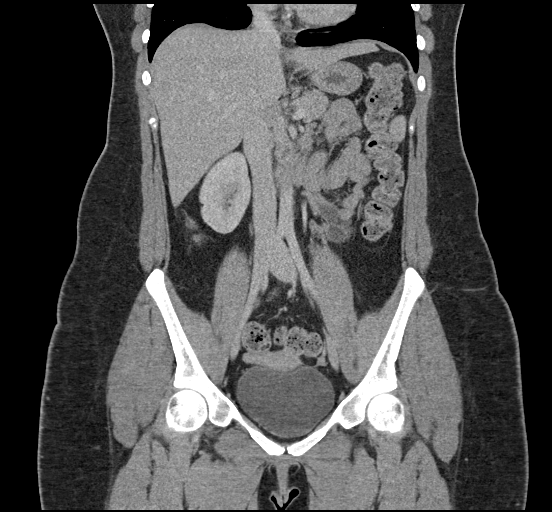
[im 96/172  soft-tissue]
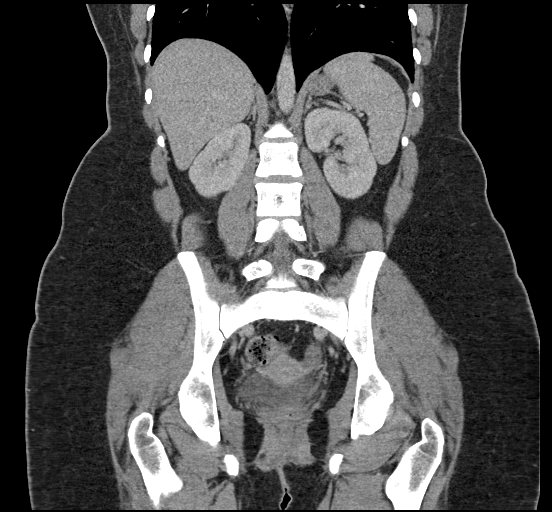

[16 of 46 positions shown; findings below may reference images not displayed]

RADIATION DOSE REDUCTION: This exam was performed according to the
departmental dose-optimization program which includes automated
exposure control, adjustment of the mA and/or kV according to
patient size and/or use of iterative reconstruction technique.

CONTRAST:  100mL OMNIPAQUE IOHEXOL 300 MG/ML  SOLN
FINDINGS: Lower chest: There are few small pleural-based plaque-like nodular
densities in the posterior lower lung fields. There is no focal
pulmonary consolidation in the lower lung fields. There is no
pleural effusion.

Hepatobiliary: Liver measures 18.1 cm in length. No focal
abnormality is seen. Gallbladder is unremarkable.

Pancreas: No focal abnormality is seen.

Spleen: Unremarkable

Adrenals/Urinary Tract: Adrenals are not enlarged. There is no
hydronephrosis. There are no renal or ureteral stones. Urinary
bladder is unremarkable.

Stomach/Bowel: Stomach is unremarkable. Small bowel loops are not
dilated. Appendix is not dilated. There is no significant wall
thickening in colon. There is no pericolic stranding.

Vascular/Lymphatic: Unremarkable.

Reproductive: There is 2.9 cm low-density structure in the left
adnexa, possibly a follicle or functional cyst.

Other: There is no ascites or pneumoperitoneum. Small umbilical
hernia containing fat is seen.

Musculoskeletal: Unremarkable.
IMPRESSION: There is no evidence of intestinal obstruction or pneumoperitoneum.
There is no hydronephrosis. Appendix is not dilated.

2.9 cm smooth marginated low-density structure in the left adnexa
may suggest dominant follicle or functional cyst in the left ovary.

Other findings as described in the body of the report.

## 2023-05-06 ENCOUNTER — Telehealth: Payer: Self-pay

## 2023-05-06 ENCOUNTER — Telehealth: Payer: Self-pay | Admitting: Adult Health

## 2023-05-06 NOTE — Telephone Encounter (Signed)
 Copied from CRM [id number]. Topic: Clinical - Medication Refill >> May 03, 2023  2:14 PM Burnard DEL wrote: Most Recent Primary Care Visit:  Provider: MERNA HUXLEY  Department: [facility]  Visit Type: OFFICE VISIT  Date: 02/26/2023  Medication:  cetirizine  (ZYRTEC ) 10 MG tablet   Has the patient contacted their pharmacy? Yes (Agent: If no, request that the patient contact the pharmacy for the refill. If patient does not wish to contact the pharmacy document the reason why and proceed with request.) (Agent: If yes, when and what did the pharmacy advise?)  Is this the correct pharmacy for this prescription? Yes If no, delete pharmacy and type the correct one.  This is the patient's preferred pharmacy:  [id number] - [place], [place] - [address] AT [facility] OF [address] [place] [address] Phone: [phone] Fax: [phone]   Has the prescription been filled recently? No  Is the patient out of the medication? Yes  Has the patient been seen for an appointment in the last year OR does the patient have an upcoming appointment? Yes  Can we respond through MyChart? Yes  Agent: Please be advised that Rx refills may take up to 3 business days. We ask that you follow-up with your pharmacy.

## 2023-05-06 NOTE — Telephone Encounter (Signed)
 Copied from CRM [id number]. Topic: Clinical - Prescription Issue >> May 06, 2023  2:23 PM Turkey A wrote: Reason for CRM: Patient called because she wanted to know why her medication cetirizine (ZYRTEC) 10 MG table was not approved

## 2023-05-07 MED ORDER — CETIRIZINE HCL 10 MG PO TABS: ORAL_TABLET | ORAL | 1 refills | Status: DC

## 2023-05-07 NOTE — Telephone Encounter (Signed)
 Not sure what pt is referring to. No medication was denied and medication was sent in today.

## 2023-05-08 ENCOUNTER — Telehealth (INDEPENDENT_AMBULATORY_CARE_PROVIDER_SITE_OTHER): Payer: 59 | Admitting: Nurse Practitioner

## 2023-05-08 ENCOUNTER — Ambulatory Visit: Payer: Self-pay | Admitting: Adult Health

## 2023-05-08 DIAGNOSIS — L509 Urticaria, unspecified: Secondary | ICD-10-CM | POA: Insufficient documentation

## 2023-05-08 MED ORDER — HYDROXYZINE HCL 10 MG PO TABS: 10.0000 mg | ORAL_TABLET | Freq: Three times a day (TID) | ORAL | 0 refills | Status: DC | PRN

## 2023-05-08 NOTE — Assessment & Plan Note (Addendum)
 Acute Likely caused by being out of her daily Zyrtec  for about 2 weeks.  She was encouraged to restart this medication now that the prescription has been sent to her pharmacy.  We also discussed use of topical Benadryl  on the hives that are present to help manage the itching when she has to work or drive.  I will prescribe hydroxyzine  10 mg tablet that she can take every 8 hours as needed on days that she is off from work or to be taken at night as needed due to the itching.  Per chart review has not had updated labs for a few months so I recommended she get these done with her PCP and have an annual physical exam as soon as possible.  She reports her understanding.  We did discuss possible referral to allergist, but she would prefer to defer this to see if restarting the Zyrtec  results in controlling her hives.  We also discussed use of cold compress, avoiding scratching with nails, and considering using gloves that cover her forearms while she is at work or exposed to animals until she has been on the Zyrtec  for at least a few days.  Told to not take hydroxyzine  if she were to miss a menstrual cycle until pregnancy has been ruled out. She reports her understanding.

## 2023-05-08 NOTE — Telephone Encounter (Signed)
 Chief Complaint: Hives Symptoms: Red and swollen hives, itching Frequency: getting worse since Saturday Pertinent Negatives: Patient denies difficulty breathing, tongue swelling, fever Disposition: [] ED /[] Urgent Care (no appt availability in office) / [x] Appointment(In office/virtual)/ []  [place] [facility]/ [] Home Care/ [] Refused Recommended Disposition /[] [facility]/ []  Follow-up with PCP Additional Notes: Patient called in stating she has hives on her arms and back that are about the size of a half dollar. These hives developed and have been getting worse since Saturday and she has been taking benadryl  and Zyrtec . 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 Per protocol, patient to be evaluated with worsening hives with use of antihistamine. HCP Virtual appointment created for this evening.   Copied from CRM [id number]. Topic: Clinical - Red Word Triage >> May 08, 2023  2:21 PM Crist Dominion wrote: Reason for CRM: Severe hives with swelling Reason for Disposition  [1] Hives have become worse AND [2] taking oral steroids (e.g., prednisone ) > 24 hours  Answer Assessment - Initial Assessment Questions 1. APPEARANCE: "What does the rash look like?"      Clustered in some area, red hives that are swollen 2. LOCATION: "Where is the rash located?"      Arms and back 3. NUMBER: "How many hives are there?"      At least 10 4. SIZE: "How big are the hives?" (inches, cm, compare to coins) "Do they all look the same or is there lots of variation in shape and size?"      Biggest one is probably a half dollar 5. ONSET: "When did the hives begin?" (Hours or days ago)      Saturday 6. ITCHING: "Does it itch?" If Yes, ask: "How bad is the itch?"    - MILD: doesn't interfere with normal activities   - MODERATE-SEVERE: interferes with work, school, sleep, or other activities      Pretty bad itchiness 7. RECURRENT PROBLEM: "Have  you had hives before?" If Yes, ask: "When was the last time?" and "What happened that time?"      Yes  8. TRIGGERS: "Were you exposed to any new food, plant, cosmetic product or animal just before the hives began?"     Believe it is due to her allergic reaction to cats and has been staying at mom's due to weather 9. OTHER SYMPTOMS: "Do you have any other symptoms?" (e.g., fever, tongue swelling, difficulty breathing, abdomen pain)     no  Protocols used: Hives-A-AH

## 2023-05-08 NOTE — Progress Notes (Signed)
 Established Patient Office Visit  An audio/visual tele-health visit was completed today for this patient. I connected with  Robin Hoover on 05/08/23 utilizing audio/visual technology and verified that I am speaking with the correct person using two identifiers. The patient was located at their place of employment, and I was located at the office of [facility] at [facility] during the encounter. I discussed the limitations of evaluation and management by telemedicine. The patient expressed understanding and agreed to proceed.     Subjective   Patient ID: Robin Hoover, female    DOB: 09-23-2000  Age: 23 y.o. MRN: [id number]  Chief Complaint  Patient presents with   Urticaria    Patient has for her visit today regarding hives.  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  She has her own dogs and is also recently been staying with her mom due to the incoming weather we have been having and her mom has dogs as well.  She also works as a Web designer.  She normally takes daily Zyrtec  but prescription ran out and she was off of that for about 2 weeks.  She reports that the prescription has been sent back into her pharmacy she has not yet picked it up but during that 2-week time period started developing hives especially on her arm and back.  She has been self-medicating at home with a friend's hydroxyzine  prescription and also took Zyrtec  3 days and 2 days ago, but has not taken any yesterday or today.  She reports the original hives are improving but she has been getting new hives since taking the hydroxyzine  and is concerned regarding this.  She denies any swelling of the tongue, lips, throat and no wheezing or shortness of breath.  Patient is on oral contraceptive pill and has not missed menstrual period as of yet, denies any risk that she could be pregnant currently.    ROS: see HPi    Objective:      LMP 04/09/2023    Physical Exam Comprehensive physical exam not completed today as office visit was conducted remotely.  Patient appears well over video, no evidence of respiratory distress..  Patient was alert and oriented, and appeared to have appropriate judgment.   No results found for any visits on 05/08/23.    The ASCVD Risk score (Arnett DK, et al., 2019) failed to calculate for the following reasons:   The 2019 ASCVD risk score is only valid for ages 61 to 67    Assessment & Plan:   Problem List Items Addressed This Visit       Musculoskeletal and Integument   Hives - Primary   Acute Likely caused by being out of her daily Zyrtec  for about 2 weeks.  She was encouraged to restart this medication now that the prescription has been sent to her pharmacy.  We also discussed use of topical Benadryl  on the hives that are present to help manage the itching when she has to work or drive.  I will prescribe hydroxyzine  10 mg tablet that she can take every 8 hours as needed on days that she is off from work or to be taken at night as needed due to the itching.  Per chart review has not had updated labs for a few months so I recommended she get these done with her PCP and have an annual physical exam as soon as possible.  She reports her understanding.  We did discuss possible referral to allergist, but she would prefer to defer this to see if restarting the Zyrtec  results in controlling her hives.  We also discussed use of cold compress, avoiding scratching with nails, and considering using gloves that cover her forearms while she is at work or exposed to animals until she has been on the Zyrtec  for at least a few days.  Told to not take hydroxyzine  if she were to miss a menstrual cycle until pregnancy has been ruled out. She reports her understanding.      Relevant Medications   hydrOXYzine  (ATARAX ) 10 MG tablet    Return in about 1 month (around 06/08/2023) for  CPE with PCP.     Zorita Hiss, NP

## 2023-05-13 ENCOUNTER — Telehealth: Payer: Self-pay | Admitting: Adult Health

## 2023-05-13 NOTE — Telephone Encounter (Signed)
Copied from CRM [id number]. Topic: Clinical - Medication Refill >> May 13, 2023 12:13 PM Fredrich Romans wrote: Most Recent Primary Care Visit:  Provider: Elenore Paddy  Department: [facility]  Visit Type: ACUTE  Date: 05/08/2023  Medication:  omeprazole (PRILOSEC) 40 MG capsule   Has the patient contacted their pharmacy? No (Agent: If no, request that the patient contact the pharmacy for the refill. If patient does not wish to contact the pharmacy document the reason why and proceed with request.) (Agent: If yes, when and what did the pharmacy advise?)  Is this the correct pharmacy for this prescription? Yes If no, delete pharmacy and type the correct one.  This is the patient's preferred pharmacy:  [id number] - [place], [place] - [address] AT [facility] OF [address] [place] [address] Phone: [phone] Fax: [phone]   Has the prescription been filled recently? No  Is the patient out of the medication? Yes  Has the patient been seen for an appointment in the last year OR does the patient have an upcoming appointment? Yes  Can we respond through MyChart? Yes  Agent: Please be advised that Rx refills may take up to 3 business days. We ask that you follow-up with your pharmacy.

## 2023-05-14 ENCOUNTER — Telehealth: Payer: Self-pay

## 2023-05-14 ENCOUNTER — Other Ambulatory Visit: Payer: Self-pay

## 2023-05-14 DIAGNOSIS — F411 Generalized anxiety disorder: Secondary | ICD-10-CM | POA: Diagnosis not present

## 2023-05-14 DIAGNOSIS — F902 Attention-deficit hyperactivity disorder, combined type: Secondary | ICD-10-CM | POA: Diagnosis not present

## 2023-05-14 DIAGNOSIS — F321 Major depressive disorder, single episode, moderate: Secondary | ICD-10-CM | POA: Diagnosis not present

## 2023-05-14 MED ORDER — OMEPRAZOLE 40 MG PO CPDR: DELAYED_RELEASE_CAPSULE | ORAL | 0 refills | Status: DC

## 2023-05-14 NOTE — Telephone Encounter (Signed)
This has been taking care of.

## 2023-05-14 NOTE — Telephone Encounter (Signed)
Rx refilled.

## 2023-05-14 NOTE — Telephone Encounter (Signed)
Copied from CRM [id number]. Topic: Clinical - Prescription Issue >> May 14, 2023 12:04 PM Theodis Sato wrote: Reason for CRM: Patient states there is a disconnect going on between the clinic and her pharmacy. Patient states she has been waiting for her  omeprazole (PRILOSEC) 40 MG capsule since Friday and is requesting Shirline Frees to fill this for her.

## 2023-05-15 ENCOUNTER — Telehealth: Payer: Self-pay | Admitting: Adult Health

## 2023-05-15 ENCOUNTER — Ambulatory Visit: Payer: Self-pay | Admitting: Adult Health

## 2023-05-15 NOTE — Telephone Encounter (Signed)
  Chief Complaint: medication Problem Additional Notes: patient has called twice this week for the status on her omeprazole prescription. Patient states she spoke to her pharmacy today and was told that they need provider approval to dispense the medication. Prescription was confirmed by pharmacy yesterday according to [facility]. Please call patient with an update   Reason for Disposition  [1] Prescription prescribed recently is not at pharmacy AND [2] triager has access to patient's EMR AND [3] prescription is recorded in the EMR  Answer Assessment - Initial Assessment Questions 1. NAME of MEDICINE: "What medicine(s) are you calling about?"     Omeprazole 2. QUESTION: "What is your question?" (e.g., double dose of medicine, side effect)     Pharmacy is saying the medication can't be filled until provider gives approval. 3. PRESCRIBER: "Who prescribed the medicine?" Reason: if prescribed by specialist, call should be referred to that group.     Shirline Frees NP 4. SYMPTOMS: "Do you have any symptoms?" If Yes, ask: "What symptoms are you having?"  "How bad are the symptoms (e.g., mild, moderate, severe)     No symptoms 5. PREGNANCY:  "Is there any chance that you are pregnant?" "When was your last menstrual period?"     No  Protocols used: Medication Question Call-A-AH

## 2023-05-15 NOTE — Telephone Encounter (Signed)
Patient was called and voicemail left to call back with office number.   Copied from CRM [id number]. Topic: Clinical - Prescription Issue >> May 15, 2023  4:22 PM Leavy Cella D wrote: Reason for CRM: PATIENT CALLED IN REGARDS TO HER omeprazole (PRILOSEC) 40 MG capsule MEDICATION REFILL . Patient would like a call back

## 2023-05-16 ENCOUNTER — Other Ambulatory Visit: Payer: Self-pay

## 2023-05-16 MED ORDER — OMEPRAZOLE 40 MG PO CPDR: DELAYED_RELEASE_CAPSULE | ORAL | 0 refills | Status: DC

## 2023-05-16 NOTE — Telephone Encounter (Signed)
This was already taken care of multiple times. Called pt to see if she checked with pharmacy. Pt stated that she will call them again today bc her app is showing that it was not filled. I sent the medication again and will call the pharmacy to make sure it went through. Pt verbalized understanding.

## 2023-05-17 ENCOUNTER — Encounter: Payer: Self-pay | Admitting: Obstetrics and Gynecology

## 2023-05-21 NOTE — Telephone Encounter (Signed)
Please see other note

## 2023-05-30 NOTE — Telephone Encounter (Signed)
 Pt scheduled for 06/10/2023 @ 415.  Encounter closed.

## 2023-06-06 ENCOUNTER — Ambulatory Visit: Payer: 59 | Admitting: Obstetrics and Gynecology

## 2023-06-10 ENCOUNTER — Ambulatory Visit: Payer: 59 | Admitting: Obstetrics and Gynecology

## 2023-06-11 ENCOUNTER — Ambulatory Visit: Payer: 59 | Admitting: Obstetrics and Gynecology

## 2023-06-14 ENCOUNTER — Other Ambulatory Visit (HOSPITAL_COMMUNITY)
Admission: RE | Admit: 2023-06-14 | Discharge: 2023-06-14 | Disposition: A | Discharge status: Home / Self Care | Payer: 59 | Source: Ambulatory Visit | Attending: Adult Health | Admitting: Adult Health

## 2023-06-14 ENCOUNTER — Encounter: Payer: Self-pay | Admitting: Adult Health

## 2023-06-14 ENCOUNTER — Ambulatory Visit: Payer: 59 | Admitting: Adult Health

## 2023-06-14 VITALS — BP 120/82 | HR 94 | Temp 99.4°F | Ht 66.0 in | Wt 229.0 lb

## 2023-06-14 DIAGNOSIS — B9689 Other specified bacterial agents as the cause of diseases classified elsewhere: Secondary | ICD-10-CM | POA: Insufficient documentation

## 2023-06-14 DIAGNOSIS — N760 Acute vaginitis: Secondary | ICD-10-CM | POA: Insufficient documentation

## 2023-06-14 DIAGNOSIS — Z113 Encounter for screening for infections with a predominantly sexual mode of transmission: Secondary | ICD-10-CM

## 2023-06-14 DIAGNOSIS — B3731 Acute candidiasis of vulva and vagina: Secondary | ICD-10-CM | POA: Insufficient documentation

## 2023-06-14 DIAGNOSIS — J069 Acute upper respiratory infection, unspecified: Secondary | ICD-10-CM | POA: Diagnosis not present

## 2023-06-14 LAB — POC COVID19 BINAXNOW: SARS Coronavirus 2 Ag: NEGATIVE

## 2023-06-14 LAB — POCT INFLUENZA A/B
Influenza A, POC: NEGATIVE
Influenza B, POC: NEGATIVE

## 2023-06-14 LAB — POCT RAPID STREP A (OFFICE): Rapid Strep A Screen: NEGATIVE

## 2023-06-14 NOTE — Progress Notes (Signed)
Subjective:    Patient ID: Robin Hoover, female    DOB: 23-Mar-2001, 23 y.o.   MRN: [id number]  HPI 23 year old female who  has a past medical history of ADHD (attention deficit hyperactivity disorder), Anxiety, Asthma, mild intermittent, Carpal tunnel syndrome, Depression, GERD (gastroesophageal reflux disease) (05/20/2013), PMDD (premenstrual dysphoric disorder), and PTSD (post-traumatic stress disorder).  She presents to the office today for an acute issue. She reports that the last week she has been experiencing nasal congestion, cough, sore throat, and fatigue. Denies fevers chills, n/v/d/.   Using Dayquil and Nyquil which helps her symptoms   She also reports that a month ago she had unprotected sex. She is worried she may have an STD. She denies discharge or drainage but does have vaginal itching.    Review of Systems See HPI   Past Medical History:  Diagnosis Date   ADHD (attention deficit hyperactivity disorder)    Anxiety    Asthma, mild intermittent    Carpal tunnel syndrome    Depression    GERD (gastroesophageal reflux disease) 05/20/2013   PMDD (premenstrual dysphoric disorder)    PTSD (post-traumatic stress disorder)     Social History   Socioeconomic History   Marital status: Single    Spouse name: Not on file   Number of children: Not on file   Years of education: Not on file   Highest education level: Not on file  Occupational History   Not on file  Tobacco Use   Smoking status: Never   Smokeless tobacco: Never  Vaping Use   Vaping status: Never Used  Substance and Sexual Activity   Alcohol use: Yes    Alcohol/week: 1.0 standard drink of alcohol    Types: 1 Shots of liquor per week   Drug use: Yes   Sexual activity: Not Currently    Birth control/protection: Abstinence  Other Topics Concern   Not on file  Social History Narrative   She likes to sleep, watch tv   Social Drivers of Health   Financial Resource Strain: Not on file   Food Insecurity: Food Insecurity Present (10/10/2020)   Hunger Vital Sign    Worried About Running Out of Food in the Last Year: Sometimes true    Ran Out of Food in the Last Year: Sometimes true  Transportation Needs: No Transportation Needs (10/10/2020)   PRAPARE - Administrator, Civil Service (Medical): No    Lack of Transportation (Non-Medical): No  Physical Activity: Not on file  Stress: Not on file  Social Connections: Not on file  Intimate Partner Violence: Not on file    Past Surgical History:  Procedure Laterality Date   COLONOSCOPY     UPPER GI ENDOSCOPY      Family History  Problem Relation Age of Onset   Diabetes Mother    Bipolar disorder Father    Narcolepsy Father    Bipolar disorder Sister    Narcolepsy Sister    Bipolar disorder Paternal Aunt    Breast cancer Neg Hx    Ovarian cancer Neg Hx     Allergies  Allergen Reactions   Pollen Extract Anaphylaxis   Dust Mite Mixed Allergen Ext [Mite (D. Farinae)]    Molds & Smuts     Current Outpatient Medications on File Prior to Visit  Medication Sig Dispense Refill   albuterol (VENTOLIN HFA) 108 (90 Base) MCG/ACT inhaler Inhale 1-2 puffs into the lungs as directed. 6.7 g 2  atomoxetine (STRATTERA) 40 MG capsule Take 25 mg by mouth every morning.     busPIRone (BUSPAR) 10 MG tablet Take 10 mg by mouth as needed.     cetirizine (ZYRTEC) 10 MG tablet TAKE 1 TABLET(10 MG) BY MOUTH DAILY 90 tablet 1   drospirenone-ethinyl estradiol (YAZ) 3-0.02 MG tablet Take 1 tablet by mouth daily. 84 tablet 3   methylPREDNISolone (MEDROL DOSEPAK) 4 MG TBPK tablet Take as directed 21 tablet 0   omeprazole (PRILOSEC) 40 MG capsule TAKE 1 CAPSULE(40 MG) BY MOUTH DAILY 30 capsule 0   venlafaxine XR (EFFEXOR-XR) 150 MG 24 hr capsule Take 1 capsule (150 mg total) by mouth every morning. 2 capsule 0   No current facility-administered medications on file prior to visit.    BP 120/82   Pulse 94   Temp 99.4 F (37.4  C) (Oral)   Ht 5\' 6"  (1.676 m)   Wt 229 lb (103.9 kg)   SpO2 94%   BMI 36.96 kg/m       Objective:   Physical Exam Vitals and nursing note reviewed.  Constitutional:      Appearance: Normal appearance.  HENT:     Right Ear: Tympanic membrane, ear canal and external ear normal. There is no impacted cerumen.     Left Ear: Tympanic membrane, ear canal and external ear normal. There is no impacted cerumen.     Nose: Congestion present. No rhinorrhea.     Right Turbinates: Not enlarged or swollen.     Left Turbinates: Not enlarged or swollen.     Right Sinus: Maxillary sinus tenderness present. No frontal sinus tenderness.     Left Sinus: Maxillary sinus tenderness present. No frontal sinus tenderness.     Mouth/Throat:     Mouth: Mucous membranes are moist.     Pharynx: Oropharynx is clear. No oropharyngeal exudate or posterior oropharyngeal erythema.  Cardiovascular:     Rate and Rhythm: Regular rhythm.     Pulses: Normal pulses.     Heart sounds: Normal heart sounds.  Pulmonary:     Effort: Pulmonary effort is normal.     Breath sounds: Normal breath sounds.  Musculoskeletal:        General: Normal range of motion.  Skin:    General: Skin is warm and dry.  Neurological:     General: No focal deficit present.     Mental Status: She is alert and oriented to person, place, and time.  Psychiatric:        Mood and Affect: Mood normal.        Behavior: Behavior normal.        Thought Content: Thought content normal.        Judgment: Judgment normal.       Assessment & Plan:  1. Upper respiratory tract infection, unspecified type (Primary) - Likely viral in origin. Advised rest, hydration and can continue with OTC medications - Follow up if symptoms last longer than 7-10 days  - POC COVID-19 BinaxNow- negative - POCT rapid strep A- Negative - POCT Influenza A/B- Negative   2. Screening for STD (sexually transmitted disease)  - Cervicovaginal ancillary only;  Future  Shirline Frees, NP

## 2023-06-18 ENCOUNTER — Encounter: Payer: Self-pay | Admitting: Adult Health

## 2023-06-18 ENCOUNTER — Other Ambulatory Visit: Payer: Self-pay | Admitting: Adult Health

## 2023-06-18 LAB — CERVICOVAGINAL ANCILLARY ONLY
Bacterial Vaginitis (gardnerella): POSITIVE — AB
Candida Glabrata: NEGATIVE
Candida Vaginitis: POSITIVE — AB
Chlamydia: NEGATIVE
Comment: NEGATIVE
Comment: NEGATIVE
Comment: NEGATIVE
Comment: NEGATIVE
Comment: NEGATIVE
Comment: NORMAL
Neisseria Gonorrhea: NEGATIVE
Trichomonas: NEGATIVE

## 2023-06-18 MED ORDER — FLUCONAZOLE 150 MG PO TABS
150.0000 mg | ORAL_TABLET | Freq: Once | ORAL | 0 refills | Status: AC
Start: 2023-06-18 — End: 2023-06-18

## 2023-06-18 MED ORDER — METRONIDAZOLE 500 MG PO TABS: 500.0000 mg | ORAL_TABLET | Freq: Three times a day (TID) | ORAL | 0 refills | Status: AC

## 2023-06-19 ENCOUNTER — Ambulatory Visit: Payer: 59 | Admitting: Obstetrics and Gynecology

## 2023-07-12 ENCOUNTER — Other Ambulatory Visit: Payer: Self-pay

## 2023-07-12 DIAGNOSIS — F3281 Premenstrual dysphoric disorder: Secondary | ICD-10-CM

## 2023-07-12 MED ORDER — DROSPIRENONE-ETHINYL ESTRADIOL 3-0.02 MG PO TABS: 1.0000 | ORAL_TABLET | Freq: Every day | ORAL | 1 refills | Status: DC

## 2023-07-12 NOTE — Telephone Encounter (Signed)
 Med refill request: YAZ Last AEX: last OV 02/21/23 Next AEX: not scheduled  Last MMG (if hormonal med) n/a Refill authorized: last rx 02/21/23 #84 with 3 refills. Patient left VM and is [facility] of [facility]. Please approve or deny as appropriate

## 2023-07-18 ENCOUNTER — Other Ambulatory Visit: Payer: Self-pay | Admitting: Adult Health

## 2023-07-18 MED ORDER — CETIRIZINE HCL 10 MG PO TABS: ORAL_TABLET | ORAL | 1 refills | Status: DC

## 2023-07-18 MED ORDER — OMEPRAZOLE 40 MG PO CPDR: DELAYED_RELEASE_CAPSULE | ORAL | 0 refills | Status: DC

## 2023-07-18 NOTE — Telephone Encounter (Signed)
 Copied from CRM [id number]. Topic: Clinical - Medication Refill >> Jul 18, 2023  1:16 PM Elizebeth Brooking wrote: Most Recent Primary Care Visit:  Provider: Shirline Frees  Department: [facility]  Visit Type: ACUTE  Date: 06/14/2023  Medication: omeprazole (PRILOSEC) 40 MG capsule cetirizine (ZYRTEC) 10 MG tablet  Has the patient contacted their pharmacy? Yes (Agent: If no, request that the patient contact the pharmacy for the refill. If patient does not wish to contact the pharmacy document the reason why and proceed with request.) (Agent: If yes, when and what did the pharmacy advise?)  Is this the correct pharmacy for this prescription? Yes If no, delete pharmacy and type the correct one.  This is the patient's preferred pharmacy:  [id number] - [place], [place] - [address] AT [facility] [address] [place] [address] Phone: [phone] Fax: [phone]   Has the prescription been filled recently? No  Is the patient out of the medication? Yes  Has the patient been seen for an appointment in the last year OR does the patient have an upcoming appointment? Yes  Can we respond through MyChart? Yes  Agent: Please be advised that Rx refills may take up to 3 business days. We ask that you follow-up with your pharmacy.

## 2023-08-05 DIAGNOSIS — F902 Attention-deficit hyperactivity disorder, combined type: Secondary | ICD-10-CM | POA: Diagnosis not present

## 2023-08-05 DIAGNOSIS — F411 Generalized anxiety disorder: Secondary | ICD-10-CM | POA: Diagnosis not present

## 2023-08-05 DIAGNOSIS — F321 Major depressive disorder, single episode, moderate: Secondary | ICD-10-CM | POA: Diagnosis not present

## 2023-08-14 ENCOUNTER — Other Ambulatory Visit: Payer: Self-pay | Admitting: Adult Health

## 2023-08-29 ENCOUNTER — Ambulatory Visit: Admitting: Adult Health

## 2023-08-29 VITALS — BP 110/80 | HR 82 | Temp 98.7°F | Ht 66.0 in | Wt 228.0 lb

## 2023-08-29 DIAGNOSIS — R5383 Other fatigue: Secondary | ICD-10-CM | POA: Diagnosis not present

## 2023-08-29 DIAGNOSIS — J302 Other seasonal allergic rhinitis: Secondary | ICD-10-CM | POA: Diagnosis not present

## 2023-08-29 DIAGNOSIS — G4733 Obstructive sleep apnea (adult) (pediatric): Secondary | ICD-10-CM | POA: Diagnosis not present

## 2023-08-29 LAB — IBC + FERRITIN
Ferritin: 38.3 ng/mL (ref 10.0–291.0)
Iron: 101 ug/dL (ref 42–145)
Saturation Ratios: 23 % (ref 20.0–50.0)
TIBC: 438.2 ug/dL (ref 250.0–450.0)
Transferrin: 313 mg/dL (ref 212.0–360.0)

## 2023-08-29 LAB — CBC
HCT: 42.4 % (ref 36.0–46.0)
Hemoglobin: 14 g/dL (ref 12.0–15.0)
MCHC: 33.1 g/dL (ref 30.0–36.0)
MCV: 80.7 fl (ref 78.0–100.0)
Platelets: 394 10*3/uL (ref 150.0–400.0)
RBC: 5.25 Mil/uL — ABNORMAL HIGH (ref 3.87–5.11)
RDW: 14 % (ref 11.5–15.5)
WBC: 7.2 10*3/uL (ref 4.0–10.5)

## 2023-08-29 LAB — BASIC METABOLIC PANEL WITH GFR
BUN: 12 mg/dL (ref 6–23)
CO2: 27 meq/L (ref 19–32)
Calcium: 9.2 mg/dL (ref 8.4–10.5)
Chloride: 101 meq/L (ref 96–112)
Creatinine, Ser: 0.65 mg/dL (ref 0.40–1.20)
GFR: 123.36 mL/min (ref >60.00)
Glucose, Bld: 89 mg/dL (ref 70–99)
Potassium: 4.1 meq/L (ref 3.5–5.1)
Sodium: 137 meq/L (ref 135–145)

## 2023-08-29 LAB — VITAMIN D 25 HYDROXY (VIT D DEFICIENCY, FRACTURES): VITD: 24.3 ng/mL — ABNORMAL LOW (ref 30.00–100.00)

## 2023-08-29 LAB — TSH: TSH: 2.85 u[IU]/mL (ref 0.35–5.50)

## 2023-08-29 LAB — VITAMIN B12: Vitamin B-12: 220 pg/mL (ref 211–911)

## 2023-08-29 NOTE — Progress Notes (Signed)
 Subjective:    Patient ID: Robin Hoover, female    DOB: 2000-10-19, 23 y.o.   MRN: [id number]  HPI 23 year old female who  has a past medical history of ADHD (attention deficit hyperactivity disorder), Anxiety, Asthma, mild intermittent, Carpal tunnel syndrome, Depression, GERD (gastroesophageal reflux disease) (05/20/2013), PMDD (premenstrual dysphoric disorder), and PTSD (post-traumatic stress disorder).  She presents to the office today for multiple complaints.   Fatigue - she reports that she has had constant fatigue over the last two months. She reports that she will wake up in the morning and feels as though she did not get any rest. She will need to take a nap in the afternoon on days that she does not work. On days that she does work she will get home around five and go to bed for the night. She does report that she gets up multiple times to urinate - she does drink a lot of water throughout the day. She was diagnosed with sleep apnea back in 2021 but never got a CPAP and had improvement in her symptoms with weight loss.  She would like to have allergy testing done - she reports she had " full body hives" over the weekend. She denies changing soaps, detergents or lotions. Her allergies and asthma have been bad this year.    Review of Systems See HPI   Past Medical History:  Diagnosis Date   ADHD (attention deficit hyperactivity disorder)    Anxiety    Asthma, mild intermittent    Carpal tunnel syndrome    Depression    GERD (gastroesophageal reflux disease) 05/20/2013   PMDD (premenstrual dysphoric disorder)    PTSD (post-traumatic stress disorder)     Social History   Socioeconomic History   Marital status: Single    Spouse name: Not on file   Number of children: Not on file   Years of education: Not on file   Highest education level: Not on file  Occupational History   Not on file  Tobacco Use   Smoking status: Never   Smokeless tobacco: Never  Vaping  Use   Vaping status: Never Used  Substance and Sexual Activity   Alcohol use: Yes    Alcohol/week: 1.0 standard drink of alcohol    Types: 1 Shots of liquor per week   Drug use: Yes   Sexual activity: Not Currently    Birth control/protection: Abstinence  Other Topics Concern   Not on file  Social History Narrative   She likes to sleep, watch tv   Social Drivers of Health   Financial Resource Strain: Not on file  Food Insecurity: Food Insecurity Present (10/10/2020)   Hunger Vital Sign    Worried About Running Out of Food in the Last Year: Sometimes true    Ran Out of Food in the Last Year: Sometimes true  Transportation Needs: No Transportation Needs (10/10/2020)   PRAPARE - Administrator, Civil Service (Medical): No    Lack of Transportation (Non-Medical): No  Physical Activity: Not on file  Stress: Not on file  Social Connections: Not on file  Intimate Partner Violence: Not on file    Past Surgical History:  Procedure Laterality Date   COLONOSCOPY     UPPER GI ENDOSCOPY      Family History  Problem Relation Age of Onset   Diabetes Mother    Bipolar disorder Father    Narcolepsy Father    Bipolar disorder Sister  Narcolepsy Sister    Bipolar disorder Paternal Aunt    Breast cancer Neg Hx    Ovarian cancer Neg Hx     Allergies  Allergen Reactions   Pollen Extract Anaphylaxis   Dust Mite Mixed Allergen Ext [Mite (D. Farinae)]    Molds & Smuts     Current Outpatient Medications on File Prior to Visit  Medication Sig Dispense Refill   albuterol  (VENTOLIN  HFA) 108 (90 Base) MCG/ACT inhaler Inhale 1-2 puffs into the lungs as directed. 6.7 g 2   atomoxetine (STRATTERA) 40 MG capsule Take 25 mg by mouth every morning.     busPIRone (BUSPAR) 10 MG tablet Take 10 mg by mouth as needed.     cetirizine  (ZYRTEC ) 10 MG tablet TAKE 1 TABLET(10 MG) BY MOUTH DAILY 90 tablet 1   drospirenone -ethinyl estradiol  (YAZ) 3-0.02 MG tablet Take 1 tablet by mouth  daily. 84 tablet 1   methylPREDNISolone  (MEDROL  DOSEPAK) 4 MG TBPK tablet Take as directed 21 tablet 0   omeprazole  (PRILOSEC) 40 MG capsule TAKE 1 CAPSULE(40 MG) BY MOUTH DAILY 30 capsule 0   venlafaxine  XR (EFFEXOR -XR) 150 MG 24 hr capsule Take 1 capsule (150 mg total) by mouth every morning. 2 capsule 0   No current facility-administered medications on file prior to visit.    BP 110/80   Pulse 82   Temp 98.7 F (37.1 C) (Oral)   Ht 5\' 6"  (1.676 m)   Wt 228 lb (103.4 kg)   SpO2 94%   BMI 36.80 kg/m       Objective:   Physical Exam Vitals and nursing note reviewed.  Constitutional:      Appearance: Normal appearance.  Cardiovascular:     Rate and Rhythm: Normal rate and regular rhythm.     Pulses: Normal pulses.     Heart sounds: Normal heart sounds.  Pulmonary:     Effort: Pulmonary effort is normal.     Breath sounds: Normal breath sounds.  Musculoskeletal:        General: Normal range of motion.  Skin:    General: Skin is warm and dry.  Neurological:     General: No focal deficit present.     Mental Status: She is alert and oriented to person, place, and time.  Psychiatric:        Mood and Affect: Mood normal.        Behavior: Behavior normal.        Thought Content: Thought content normal.        Judgment: Judgment normal.        Assessment & Plan:  1. Other fatigue (Primary) - Likely from sleep apnea. Will check labs today.  - Vitamin B12; Future - IBC + Ferritin; Future - TSH; Future - CBC; Future - Basic Metabolic Panel; Future - VITAMIN D  25 Hydroxy (Vit-D Deficiency, Fractures); Future  2. Obstructive sleep apnea syndrome - I highly advise she have a follow up with pulmonary but she does not want to go back to pulmonary at this time  - She is working on weight loss   3. Seasonal allergies  - Ambulatory referral to Allergy  Alto Atta, NP  Time spent with patient today was 32 minutes which consisted of chart review, discussing fatigue,  sleep apnea and allergies, work up, treatment answering questions and documentation.

## 2023-08-30 ENCOUNTER — Encounter: Payer: Self-pay | Admitting: Adult Health

## 2023-09-20 ENCOUNTER — Ambulatory Visit (INDEPENDENT_AMBULATORY_CARE_PROVIDER_SITE_OTHER): Admitting: Family Medicine

## 2023-09-20 ENCOUNTER — Other Ambulatory Visit: Payer: Self-pay | Admitting: Adult Health

## 2023-09-20 ENCOUNTER — Encounter: Payer: Self-pay | Admitting: Family Medicine

## 2023-09-20 VITALS — BP 132/74 | HR 84 | Temp 99.0°F | Ht 66.0 in | Wt 234.6 lb

## 2023-09-20 DIAGNOSIS — H9202 Otalgia, left ear: Secondary | ICD-10-CM

## 2023-09-20 DIAGNOSIS — H6123 Impacted cerumen, bilateral: Secondary | ICD-10-CM | POA: Diagnosis not present

## 2023-09-20 NOTE — Progress Notes (Signed)
 Established Patient Office Visit   Subjective  Patient ID: Robin Hoover, female    DOB: [birth year]  Age: 23 y.o. MRN: [id number]  Chief Complaint  Patient presents with   Medical Management of Chronic Issues    Left ear pain     Patient is a 23 year old female followed by Alto Atta, NP and seen for acute concern.  Patient endorses left ear pain x 1 day.  Symptoms started after trying to clean ears using Q-tip, fingernail and then hydrogen peroxide.  Patient endorses a history of ears frequently becoming impacted with cerumen.  Patient states left ear began hurting shortly after and hearing was decreased.  Patient denies drainage, headache, dizziness, fever, chills, neck or face pain.    Patient Active Problem List   Diagnosis Date Noted   Hives 05/08/2023   PMDD (premenstrual dysphoric disorder) 02/21/2023   Self-injurious behavior 02/21/2023   Suicidal ideation 02/21/2023   Visit for routine gyn exam 10/10/2020   Screening for STD (sexually transmitted disease) 10/10/2020   DUB (dysfunctional uterine bleeding) 10/10/2020   Migraines 01/28/2019   MDD (major depressive disorder), recurrent episode, severe (HCC) 05/20/2013   Generalized anxiety disorder 05/12/2013   ADHD (attention deficit hyperactivity disorder), combined type 03/26/2013   GERD (gastroesophageal reflux disease) 03/26/2013   Mild intermittent asthma 03/26/2013   Past Medical History:  Diagnosis Date   ADHD (attention deficit hyperactivity disorder)    Anxiety    Asthma, mild intermittent    Carpal tunnel syndrome    Depression    GERD (gastroesophageal reflux disease) 05/20/2013   PMDD (premenstrual dysphoric disorder)    PTSD (post-traumatic stress disorder)    Past Surgical History:  Procedure Laterality Date   COLONOSCOPY     UPPER GI ENDOSCOPY     Social History   Tobacco Use   Smoking status: Never   Smokeless tobacco: Never  Vaping Use   Vaping status: Never Used  Substance  Use Topics   Alcohol use: Yes    Alcohol/week: 1.0 standard drink of alcohol    Types: 1 Shots of liquor per week   Drug use: Yes   Family History  Problem Relation Age of Onset   Diabetes Mother    Bipolar disorder Father    Narcolepsy Father    Bipolar disorder Sister    Narcolepsy Sister    Bipolar disorder Paternal Aunt    Breast cancer Neg Hx    Ovarian cancer Neg Hx    Allergies  Allergen Reactions   Pollen Extract Anaphylaxis   Dust Mite Mixed Allergen Ext [Mite (D. Farinae)]    Molds & Smuts     ROS Negative unless stated above    Objective:      BP 132/74 (BP Location: Left Arm, Patient Position: Sitting, Cuff Size: Normal)   Pulse 84   Temp 99 F (37.2 C) (Oral)   Ht 5\' 6"  (1.676 m)   Wt 234 lb 9.6 oz (106.4 kg)   LMP  (LMP Unknown)   SpO2 94%   BMI 37.87 kg/m  BP Readings from Last 3 Encounters:  09/20/23 132/74  08/29/23 110/80  06/14/23 120/82   Wt Readings from Last 3 Encounters:  09/20/23 234 lb 9.6 oz (106.4 kg)  08/29/23 228 lb (103.4 kg)  06/14/23 229 lb (103.9 kg)      Physical Exam Constitutional:      General: She is not in acute distress.    Appearance: Normal appearance.  HENT:  Head: Normocephalic and atraumatic.     Right Ear: There is impacted cerumen.     Left Ear: There is impacted cerumen.     Ears:     Comments: Impacted cerumen in right ear removed with curette by this provider.  Cerumen with dried blood in canal and against left TM.  Attempted to remove cerumen in left canal but stopped due to patient discomfort and scant bleeding.    Nose: Nose normal.     Mouth/Throat:     Mouth: Mucous membranes are moist.  Cardiovascular:     Rate and Rhythm: Normal rate and regular rhythm.     Heart sounds: Normal heart sounds. No murmur heard.    No gallop.  Pulmonary:     Effort: Pulmonary effort is normal. No respiratory distress.     Breath sounds: Normal breath sounds. No wheezing, rhonchi or rales.  Skin:     General: Skin is warm and dry.  Neurological:     Mental Status: She is alert and oriented to person, place, and time.     No results found for any visits on 09/20/23.    Assessment & Plan:   Bilateral impacted cerumen  Acute otalgia, left  Patient with bilateral cerumen impaction.  Consent obtained.  Curette used by this provider to remove cerumen successfully in right canal.  Unable to remove cerumen in left canal 2/2 pt discomfort and scant bleeding.  Patient likely scratched left canal while attempting to remove cerumen at home.  Okay to use Vaseline on cotton ball in place in ear to avoid getting water in canal while washing hair/in shower.  Avoid submerging head in water.  Let left canal heal.  Revisit cerumen removal next week.  Return if symptoms worsen or fail to improve.   Viola Greulich, MD

## 2023-09-25 ENCOUNTER — Ambulatory Visit: Admitting: Adult Health

## 2023-09-25 VITALS — BP 120/70 | HR 96 | Temp 99.1°F | Ht 66.0 in | Wt 232.0 lb

## 2023-09-25 DIAGNOSIS — H6122 Impacted cerumen, left ear: Secondary | ICD-10-CM

## 2023-09-25 NOTE — Progress Notes (Signed)
 Subjective:    Patient ID: Robin Hoover, female    DOB: November 05, 2000, 23 y.o.   MRN: [id number]  Ear Fullness    23 year old female who  has a past medical history of ADHD (attention deficit hyperactivity disorder), Anxiety, Asthma, mild intermittent, Carpal tunnel syndrome, Depression, GERD (gastroesophageal reflux disease) (05/20/2013), PMDD (premenstrual dysphoric disorder), and PTSD (post-traumatic stress disorder).  23 year old female who is being evaluated today for feeling of ear fullness and hearing loss on the left side.  He does have a history of cerumen impaction.   Review of Systems See HPI   Past Medical History:  Diagnosis Date   ADHD (attention deficit hyperactivity disorder)    Anxiety    Asthma, mild intermittent    Carpal tunnel syndrome    Depression    GERD (gastroesophageal reflux disease) 05/20/2013   PMDD (premenstrual dysphoric disorder)    PTSD (post-traumatic stress disorder)     Social History   Socioeconomic History   Marital status: Single    Spouse name: Not on file   Number of children: Not on file   Years of education: Not on file   Highest education level: Not on file  Occupational History   Not on file  Tobacco Use   Smoking status: Never   Smokeless tobacco: Never  Vaping Use   Vaping status: Never Used  Substance and Sexual Activity   Alcohol use: Yes    Alcohol/week: 1.0 standard drink of alcohol    Types: 1 Shots of liquor per week   Drug use: Yes   Sexual activity: Not Currently    Birth control/protection: Abstinence  Other Topics Concern   Not on file  Social History Narrative   She likes to sleep, watch tv   Social Drivers of Health   Financial Resource Strain: Not on file  Food Insecurity: Food Insecurity Present (10/10/2020)   Hunger Vital Sign    Worried About Running Out of Food in the Last Year: Sometimes true    Ran Out of Food in the Last Year: Sometimes true  Transportation Needs: No  Transportation Needs (10/10/2020)   PRAPARE - Administrator, Civil Service (Medical): No    Lack of Transportation (Non-Medical): No  Physical Activity: Not on file  Stress: Not on file  Social Connections: Not on file  Intimate Partner Violence: Not on file    Past Surgical History:  Procedure Laterality Date   COLONOSCOPY     UPPER GI ENDOSCOPY      Family History  Problem Relation Age of Onset   Diabetes Mother    Bipolar disorder Father    Narcolepsy Father    Bipolar disorder Sister    Narcolepsy Sister    Bipolar disorder Paternal Aunt    Breast cancer Neg Hx    Ovarian cancer Neg Hx     Allergies  Allergen Reactions   Pollen Extract Anaphylaxis   Dust Mite Mixed Allergen Ext [Mite (D. Farinae)]    Molds & Smuts     Current Outpatient Medications on File Prior to Visit  Medication Sig Dispense Refill   albuterol  (VENTOLIN  HFA) 108 (90 Base) MCG/ACT inhaler Inhale 1-2 puffs into the lungs as directed. 6.7 g 2   atomoxetine (STRATTERA) 40 MG capsule Take 25 mg by mouth every morning.     busPIRone (BUSPAR) 10 MG tablet Take 10 mg by mouth as needed.     cetirizine  (ZYRTEC ) 10 MG tablet TAKE 1 TABLET(10 MG)  BY MOUTH DAILY 90 tablet 1   drospirenone -ethinyl estradiol  (YAZ) 3-0.02 MG tablet Take 1 tablet by mouth daily. 84 tablet 1   methylPREDNISolone  (MEDROL  DOSEPAK) 4 MG TBPK tablet Take as directed 21 tablet 0   omeprazole  (PRILOSEC) 40 MG capsule TAKE 1 CAPSULE(40 MG) BY MOUTH DAILY 30 capsule 0   venlafaxine  XR (EFFEXOR -XR) 150 MG 24 hr capsule Take 1 capsule (150 mg total) by mouth every morning. 2 capsule 0   No current facility-administered medications on file prior to visit.    BP 120/70   Pulse 96   Temp 99.1 F (37.3 C) (Oral)   Ht 5\' 6"  (1.676 m)   Wt 232 lb (105.2 kg)   LMP  (LMP Unknown)   SpO2 95%   BMI 37.45 kg/m       Objective:   Physical Exam Vitals and nursing note reviewed.  Constitutional:      Appearance: Normal  appearance.  HENT:     Right Ear: Tympanic membrane, ear canal and external ear normal. There is no impacted cerumen.     Left Ear: Tympanic membrane, ear canal and external ear normal. There is impacted cerumen.  Neurological:     General: No focal deficit present.     Mental Status: She is alert and oriented to person, place, and time.  Psychiatric:        Mood and Affect: Mood normal.        Behavior: Behavior normal.        Thought Content: Thought content normal.        Judgment: Judgment normal.           Assessment & Plan:   1. Impacted cerumen of left ear (Primary) Ear Cerumen Removal  Date/Time: 09/25/2023 2:06 PM  Performed by: Alto Atta, NP Authorized by: Alto Atta, NP   Anesthesia: Local Anesthetic: none Location details: left ear Patient tolerance: patient tolerated the procedure well with no immediate complications Comments: Reports symptoms resolved after cerumen was removed  Procedure type: irrigation  Sedation: Patient sedated: no     Alto Atta, NP

## 2023-10-19 ENCOUNTER — Other Ambulatory Visit: Payer: Self-pay | Admitting: Adult Health

## 2023-10-28 DIAGNOSIS — F321 Major depressive disorder, single episode, moderate: Secondary | ICD-10-CM | POA: Diagnosis not present

## 2023-10-28 DIAGNOSIS — F411 Generalized anxiety disorder: Secondary | ICD-10-CM | POA: Diagnosis not present

## 2023-10-28 DIAGNOSIS — F902 Attention-deficit hyperactivity disorder, combined type: Secondary | ICD-10-CM | POA: Diagnosis not present

## 2023-10-31 ENCOUNTER — Encounter: Payer: Self-pay | Admitting: Internal Medicine

## 2023-10-31 ENCOUNTER — Ambulatory Visit: Admitting: Internal Medicine

## 2023-10-31 ENCOUNTER — Other Ambulatory Visit: Payer: Self-pay

## 2023-10-31 VITALS — BP 130/78 | HR 87 | Temp 98.1°F | Resp 18 | Ht 66.0 in | Wt 228.1 lb

## 2023-10-31 DIAGNOSIS — L501 Idiopathic urticaria: Secondary | ICD-10-CM

## 2023-10-31 DIAGNOSIS — H1045 Other chronic allergic conjunctivitis: Secondary | ICD-10-CM | POA: Diagnosis not present

## 2023-10-31 DIAGNOSIS — K219 Gastro-esophageal reflux disease without esophagitis: Secondary | ICD-10-CM

## 2023-10-31 DIAGNOSIS — J3089 Other allergic rhinitis: Secondary | ICD-10-CM

## 2023-10-31 NOTE — Patient Instructions (Signed)
 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 Seasonal allergic rhinitis with symptoms worsening in spring and fall. Current management with Zyrtec  is not covered by insurance. - Discussed allergy immunotherapy options, logistics, and potential benefits as a long-term solution.   -information given today  - Continue Cetirizine  10mg  daily  - Scheduled allergy testing to confirm specific allergens (1-55)   Hold all antihistamines for 3 days prior   Chronic urticaria Intermittent episodes of hives possibly related to stress or environmental factors such as pollen. - Will continue to monitor hold off on further workup for now  Gastroesophageal reflux disease (GERD) GERD is well-controlled with daily medication. - Continue dietary and lifestyle modifications - Continue omeprazole  as prescribed   Follow up: 11/07/23: at 2pm for allergy testing (1-55).  Hold antihistamines for 3 days prior

## 2023-10-31 NOTE — Progress Notes (Signed)
 NEW PATIENT Date of Service/Encounter:  10/31/23 Referring provider: Merna Huxley, NP Primary care provider: Merna Huxley, NP  Subjective:  Robin Hoover is a 23 y.o. female  presenting today for evaluation of urticaria, environment  History obtained from: chart review and patient.   Discussed the use of AI scribe software for clinical note transcription with the patient, who gave verbal consent to proceed.  History of Present Illness Robin Hoover is a 23 year old female with environmental allergies who presents with allergic reactions to dogs and cats, and random hives.  Allergic reactions to animal dander - Allergic reactions to dogs and cats since elementary school, confirmed by allergy testing. - Severe reaction in childhood with hives, eye swelling, and throat swelling, requiring emergency room visit and EpiPen  administration. - Works as a Research scientist (medical); develops hives when handling very dirty dogs with certain hair textures. - No respiratory symptoms such as runny nose during dog exposure. - Reactions not related to the gender of the dog. - Cat exposure at mother's house with six cats; allergic reactions occur when napping in mother's bed, possibly due to detergent or concentrated cat allergens. - No significant reactions when only visiting the house without lying in the bed.  Seasonal allergic rhinitis - Seasonal allergy symptoms worsen in spring and fall. - Symptoms include sinus congestion and scratchy throat. - No runny eyes reported. - History of several sinus infections. - Previously took Zyrtec  daily for allergy control, but insurance no longer covers it.  Urticaria - Random episodes of hives, including a recent episode in March or April after visiting the zoo and using a new sunscreen. - Hives appeared several hours after sunscreen application, coinciding with a panic attack and high tree pollen levels. - History of hives attributed to dust  mite exposure in childhood, which prompted initial allergy testing.  Gastroesophageal reflux symptoms - Takes omeprazole  for reflux - Symptoms are controlled as long as medication is taken daily.  Other allergy screening: Asthma: no Rhino conjunctivitis: yes Food allergy: no Medication allergy: no Hymenoptera allergy: no Urticaria: yes Eczema:no History of recurrent infections suggestive of immunodeficency: no Vaccinations are up to date.   Past Medical History: Past Medical History:  Diagnosis Date   ADHD (attention deficit hyperactivity disorder)    Anxiety    Carpal tunnel syndrome    Depression    GERD (gastroesophageal reflux disease) 05/20/2013   PMDD (premenstrual dysphoric disorder)    PTSD (post-traumatic stress disorder)    Urticaria    Medication List:  Current Outpatient Medications  Medication Sig Dispense Refill   albuterol  (VENTOLIN  HFA) 108 (90 Base) MCG/ACT inhaler Inhale 1-2 puffs into the lungs as directed. 6.7 g 2   atomoxetine (STRATTERA) 40 MG capsule Take 25 mg by mouth every morning.     busPIRone (BUSPAR) 10 MG tablet Take 10 mg by mouth as needed.     drospirenone -ethinyl estradiol  (YAZ) 3-0.02 MG tablet Take 1 tablet by mouth daily. 84 tablet 1   methylPREDNISolone  (MEDROL  DOSEPAK) 4 MG TBPK tablet Take as directed 21 tablet 0   omeprazole  (PRILOSEC) 40 MG capsule TAKE 1 CAPSULE(40 MG) BY MOUTH DAILY 30 capsule 0   venlafaxine  XR (EFFEXOR -XR) 150 MG 24 hr capsule Take 1 capsule (150 mg total) by mouth every morning. 2 capsule 0   cetirizine  (ZYRTEC ) 10 MG tablet TAKE 1 TABLET(10 MG) BY MOUTH DAILY (Patient not taking: Reported on 10/31/2023) 90 tablet 1   No current facility-administered medications for this visit.  Known Allergies:  Allergies  Allergen Reactions   Pollen Extract Anaphylaxis   Dust Mite Mixed Allergen Ext [Mite (D. Farinae)]    Molds & Smuts    Past Surgical History: Past Surgical History:  Procedure Laterality Date    COLONOSCOPY     UPPER GI ENDOSCOPY     Family History: Family History  Problem Relation Age of Onset   Urticaria Mother    Eczema Mother    Diabetes Mother    Bipolar disorder Father    Narcolepsy Father    Asthma Sister    Bipolar disorder Sister    Narcolepsy Sister    Bipolar disorder Paternal Aunt    Breast cancer Neg Hx    Ovarian cancer Neg Hx    Social History: Robin Hoover lives [facility], wood throughout, gas heat and central cooling, dogs with access to bedroom, no roaches in the house and bed is 2 feet off the floor, no dust mite precautions, .   ROS:  All other systems negative except as noted per HPI.  Objective:  Blood pressure 130/78, pulse 87, temperature 98.1 F (36.7 C), temperature source Temporal, resp. rate 18, height 5' 6 (1.676 m), weight 228 lb 1.6 oz (103.5 kg), SpO2 96%. Body mass index is 36.82 kg/m. Physical Exam:  General Appearance:  Alert, cooperative, no distress, appears stated age  Head:  Normocephalic, without obvious abnormality, atraumatic  Eyes:  Conjunctiva clear, EOM's intact  Ears EACs normal bilaterally  Nose: Nares normal, erythematous nasal mucosa , hypertrophic turbinates, no visible anterior polyps, and septum midline  Throat: Lips, tongue normal; teeth and gums normal, + cobblestoning  Neck: Supple, symmetrical  Lungs:   clear to auscultation bilaterally, Respirations unlabored, no coughing  Heart:  regular rate and rhythm and no murmur, Appears well perfused  Extremities: No edema  Skin: Skin color, texture, turgor normal and no rashes or lesions on visualized portions of skin  Neurologic: No gross deficits   Diagnostics: None done    Labs:  Lab Orders         IgE         Tryptase         Chronic Urticaria (CU) Eval       Assessment and Plan  Assessment and Plan Assessment & Plan Allergic rhinitis due to animal (dog) dander, pollen  Chronic allergic rhinitis triggered by exposure to specific dog breeds, 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 Seasonal allergic rhinitis with symptoms worsening in spring and fall. Current management with Zyrtec  is not covered by insurance. - Discussed allergy immunotherapy options, logistics, and potential benefits as a long-term solution.   -information given today  - Continue Cetirizine  10mg  daily  - Scheduled allergy testing to confirm specific allergens (1-55)   Hold all antihistamines for 3 days prior   Chronic urticaria Intermittent episodes of hives possibly related to stress or environmental factors such as pollen. - Will continue to monitor hold off on further workup for now  Gastroesophageal reflux disease (GERD) GERD is well-controlled with daily medication. - Continue dietary and lifestyle modifications - Continue omeprazole  as prescribed   Follow up: 11/07/23: at 2pm for allergy testing (1-55).  Hold antihistamines for 3 days prior    This note in its entirety was forwarded to the Provider who requested this consultation.  Other:    Thank you for your kind referral. I appreciate the opportunity to take part in Tytionna's care. Please do not hesitate to contact me with questions.  Sincerely,  Thank you so much for letting me partake in your care today.  Don't hesitate to reach out if you have any additional concerns!  Hargis Springer, MD  [facility] [place], [place]

## 2023-11-06 NOTE — Patient Instructions (Incomplete)
 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 Seasonal allergic rhinitis with symptoms worsening in spring and fall. Current management with Zyrtec  is not covered by insurance. - Discussed at last office visit : allergy immunotherapy options, logistics, and potential benefits as a long-term solution.   -information given at last office visit.  She will call her insurance and let us  know if she is interested in starting [place] immunotherapy versus traditional immunotherapy. - Continue Cetirizine  10mg  daily  - Skin testing today is positive to grass pollen, weed pollen, tree pollen, mold, dust mite mix, and cat hair with adequate controls.  Intradermal skin testing is positive to [place] grass, [place] grass, major mold mix 1, major mold mix 3, major mold mix 4, dog epithelia, and [place] cockroach. - Copy of test given - Start avoidance measures as below.  Chronic urticaria Intermittent episodes of hives possibly related to stress or environmental factors such as pollen. - Will continue to monitor hold off on further workup for now  Gastroesophageal reflux disease (GERD) GERD is well-controlled with daily medication. - Continue dietary and lifestyle modifications - Continue omeprazole  as prescribed   Follow up: 3 months or sooner if needed  Control of Dog or Cat Allergen Avoidance is the best way to manage a dog or cat allergy. If you have a dog or cat and are allergic to dog or cats, consider removing the dog or cat from the home. If you have a dog or cat but don't want to find it a new home, or if your family wants a pet even though someone in the household is allergic, here are some strategies that may help keep symptoms at bay:  Keep the pet out of your bedroom and restrict it to only a few rooms. Be advised that keeping the dog or cat in only one room will not limit  the allergens to that room. Don't pet, hug or kiss the dog or cat; if you do, wash your hands with soap and water. High-efficiency particulate air (HEPA) cleaners run continuously in a bedroom or living room can reduce allergen levels over time. Regular use of a high-efficiency vacuum cleaner or a central vacuum can reduce allergen levels. Giving your dog or cat a bath at least once a week can reduce airborne allergen.  Control of Dust Mite Allergen Dust mites play a major role in allergic asthma and rhinitis. They occur in environments with high humidity wherever human skin is found. Dust mites absorb humidity from the atmosphere (ie, they do not drink) and feed on organic matter (including shed human and animal skin). Dust mites are a microscopic type of insect that you cannot see with the naked eye. High levels of dust mites have been detected from mattresses, pillows, carpets, upholstered furniture, bed covers, clothes, soft toys and any woven material. The principal allergen of the dust mite is found in its feces. A gram of dust may contain 1,000 mites and 250,000 fecal particles. Mite antigen is easily measured in the air during house cleaning activities. Dust mites do not bite and do not cause harm to humans, other than by triggering allergies/asthma.  Ways to decrease your exposure to dust mites in your home:  1. Encase mattresses, box springs and pillows with a mite-impermeable barrier or cover  2. Wash sheets, blankets and drapes weekly in hot water (130 F) with detergent and dry them  in a dryer on the hot setting.  3. Have the room cleaned frequently with a vacuum cleaner and a damp dust-mop. For carpeting or rugs, vacuuming with a vacuum cleaner equipped with a high-efficiency particulate air (HEPA) filter. The dust mite allergic individual should not be in a room which is being cleaned and should wait 1 hour after cleaning before going into the room.  4. Do not sleep on upholstered  furniture (eg, couches).  5. If possible removing carpeting, upholstered furniture and drapery from the home is ideal. Horizontal blinds should be eliminated in the rooms where the person spends the most time (bedroom, study, television room). Washable vinyl, roller-type shades are optimal.  6. Remove all non-washable stuffed toys from the bedroom. Wash stuffed toys weekly like sheets and blankets above.  7. Reduce indoor humidity to less than 50%. Inexpensive humidity monitors can be purchased at most hardware stores. Do not use a humidifier as can make the problem worse and are not recommended. Reducing Pollen Exposure The American Academy of Allergy, Asthma and Immunology suggests the following steps to reduce your exposure to pollen during allergy seasons. Do not hang sheets or clothing out to dry; pollen may collect on these items. Do not mow lawns or spend time around freshly cut grass; mowing stirs up pollen. Keep windows closed at night.  Keep car windows closed while driving. Minimize morning activities outdoors, a time when pollen counts are usually at their highest. Stay indoors as much as possible when pollen counts or humidity is high and on windy days when pollen tends to remain in the air longer. Use air conditioning when possible.  Many air conditioners have filters that trap the pollen spores. Use a HEPA room air filter to remove pollen form the indoor air you breathe.  Control of Mold Allergen Mold and fungi can grow on a variety of surfaces provided certain temperature and moisture conditions exist.  Outdoor molds grow on plants, decaying vegetation and soil.  The major outdoor mold, Alternaria and Cladosporium, are found in very high numbers during hot and dry conditions.  Generally, a late Summer - Fall peak is seen for common outdoor fungal spores.  Rain will temporarily lower outdoor mold spore count, but counts rise rapidly when the rainy period ends.  The most important  indoor molds are Aspergillus and Penicillium.  Dark, humid and poorly ventilated basements are ideal sites for mold growth.  The next most common sites of mold growth are the bathroom and the kitchen.  Outdoor [employer] Use air conditioning and keep windows closed Avoid exposure to decaying vegetation. Avoid leaf raking. Avoid grain handling. Consider wearing a face mask if working in moldy areas.  Indoor Mold Control Maintain humidity below 50%. Clean washable surfaces with 5% bleach solution. Remove sources e.g. Contaminated carpets.  Control of Cockroach Allergen  Cockroach allergen has been identified as an important cause of acute attacks of asthma, especially in urban settings.  There are fifty-five species of cockroach that exist in the [place] , however only three, the [place], [place] and [place] species produce allergen that can affect patients with Asthma.  Allergens can be obtained from fecal particles, egg casings and secretions from cockroaches.    Remove food sources. Reduce access to water. Seal access and entry points. Spray runways with 0.5-1% Diazinon or Chlorpyrifos Blow boric acid power under stoves and refrigerator. Place bait stations (hydramethylnon) at feeding sites.

## 2023-11-07 ENCOUNTER — Encounter: Payer: Self-pay | Admitting: Family

## 2023-11-07 ENCOUNTER — Ambulatory Visit: Admitting: Family

## 2023-11-07 DIAGNOSIS — J3089 Other allergic rhinitis: Secondary | ICD-10-CM | POA: Diagnosis not present

## 2023-11-07 DIAGNOSIS — J302 Other seasonal allergic rhinitis: Secondary | ICD-10-CM | POA: Diagnosis not present

## 2023-11-07 NOTE — Progress Notes (Signed)
 Date of Service/Encounter:  11/07/23  Allergy testing appointment   Initial visit on 10/31/23, seen for allergic rhinitis due to animal (dog) dander, pollen, chronic urticaria, and gastroesophageal reflux disease.  Please see that note for additional details.  Today reports for allergy diagnostic testing:    Airborne Adult Perc - 11/07/23 1458     Time Antigen Placed 0230    Allergen Manufacturer Jestine    Location Back    Number of Test 55    1. Control-Buffer 50% Glycerol Negative    2. Control-Histamine 3+    3. [place] Negative    4. [place] Negative    5. Johnson 3+    6. [place]  Blue Negative    7. Meadow Fescue Negative    8. Perennial Rye Negative    9. Timothy Negative    10. Ragweed Mix Negative    11. Cocklebur Negative    12. Plantain,  English Negative    13. Baccharis 2+    14. Dog Fennel 2+    15. Russian Thistle Negative    16. Lamb's Quarters Negative    17. Sheep Sorrell Negative    18. Rough Pigweed Negative    19. Marsh Elder, Rough Negative    20. Mugwort, Common Negative    21. Box, Elder Negative    22. Cedar, red Negative    23. Sweet Gum Negative    24. Pecan Pollen Negative    25. Pine Mix Negative    26. Walnut, Black Pollen Negative    27. Red Mulberry Negative    28. Ash Mix 2+    29. Birch Mix 2+    30. Beech American Negative    31. Cottonwood, [place] 2+    32. Hickory, White Negative    33. Maple Mix Negative    34. Oak, [place] Mix Negative    35. Sycamore Eastern Negative    36. Alternaria Alternata Negative    37. Cladosporium Herbarum Negative    38. Aspergillus Mix 2+    39. Penicillium Mix Negative    40. Bipolaris Sorokiniana (Helminthosporium) Negative    41. Drechslera Spicifera (Curvularia) Negative    42. Mucor Plumbeus Negative    43. Fusarium Moniliforme Negative    44. Aureobasidium Pullulans (pullulara) Negative    45. Rhizopus Oryzae Negative    46. Botrytis Cinera 2+    47. Epicoccum Nigrum Negative    48.  Phoma Betae Negative    49. Dust Mite Mix 4+    50. Cat Hair 10,000 BAU/ml 2+    51.  Dog Epithelia Negative    53. Horse Epithelia Negative    54. Cockroach, German Negative    55. Tobacco Leaf Negative          Intradermal - 11/07/23 1501     Time Antigen Placed 0250    Location Arm    Number of Test 11    Control Negative    [place] 3+    [place] 3+    7 Grass Negative    Ragweed Mix Negative    Weed Mix Negative    Mold 1 3+    Mold 3 3+    Mold 4 3+    Dog 3+    Cockroach 3+           DIAGNOSTICS:  Skin Testing: Environmental allergy panel. Adequate positive and negative controls Results discussed with patient/family.   Allergy testing results were read and interpreted by myself, documented by clinical staff.  Patient provided with copy of allergy testing along with avoidance measures when indicated.   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 Seasonal allergic rhinitis with symptoms worsening in spring and fall. Current management with Zyrtec  is not covered by insurance. - Discussed at last office visit : allergy immunotherapy options, logistics, and potential benefits as a long-term solution.   -information given at last office visit.  She will call her insurance and let us  know if she is interested in starting [place] immunotherapy versus traditional immunotherapy. - Continue Cetirizine  10mg  daily  - Skin testing today is positive to grass pollen, weed pollen, tree pollen, mold, dust mite mix, and cat hair with adequate controls.  Intradermal skin testing is positive to [place] grass, [place] grass, major mold mix 1, major mold mix 3, major mold mix 4, dog epithelia, and [place] cockroach. - Copy of test given - Start avoidance measures as below.  Chronic urticaria Intermittent episodes of hives possibly related to stress or  environmental factors such as pollen. - Will continue to monitor hold off on further workup for now  Gastroesophageal reflux disease (GERD) GERD is well-controlled with daily medication. - Continue dietary and lifestyle modifications - Continue omeprazole  as prescribed   Follow up: 3 months or sooner if needed  Control of Dog or Cat Allergen Avoidance is the best way to manage a dog or cat allergy. If you have a dog or cat and are allergic to dog or cats, consider removing the dog or cat from the home. If you have a dog or cat but don't want to find it a new home, or if your family wants a pet even though someone in the household is allergic, here are some strategies that may help keep symptoms at bay:  Keep the pet out of your bedroom and restrict it to only a few rooms. Be advised that keeping the dog or cat in only one room will not limit the allergens to that room. Don't pet, hug or kiss the dog or cat; if you do, wash your hands with soap and water. High-efficiency particulate air (HEPA) cleaners run continuously in a bedroom or living room can reduce allergen levels over time. Regular use of a high-efficiency vacuum cleaner or a central vacuum can reduce allergen levels. Giving your dog or cat a bath at least once a week can reduce airborne allergen.  Control of Dust Mite Allergen Dust mites play a major role in allergic asthma and rhinitis. They occur in environments with high humidity wherever human skin is found. Dust mites absorb humidity from the atmosphere (ie, they do not drink) and feed on organic matter (including shed human and animal skin). Dust mites are a microscopic type of insect that you cannot see with the naked eye. High levels of dust mites have been detected from mattresses, pillows, carpets, upholstered furniture, bed covers, clothes, soft toys and any woven material. The principal allergen of the dust mite is found in its feces. A gram of dust may contain 1,000 mites  and 250,000 fecal particles. Mite antigen is easily measured in the air during house cleaning activities. Dust mites do not bite and do not cause harm to humans, other than by triggering allergies/asthma.  Ways to decrease your exposure to dust mites in your home:  1. Encase mattresses, box springs and pillows with a mite-impermeable barrier or cover  2.  Wash sheets, blankets and drapes weekly in hot water (130 F) with detergent and dry them in a dryer on the hot setting.  3. Have the room cleaned frequently with a vacuum cleaner and a damp dust-mop. For carpeting or rugs, vacuuming with a vacuum cleaner equipped with a high-efficiency particulate air (HEPA) filter. The dust mite allergic individual should not be in a room which is being cleaned and should wait 1 hour after cleaning before going into the room.  4. Do not sleep on upholstered furniture (eg, couches).  5. If possible removing carpeting, upholstered furniture and drapery from the home is ideal. Horizontal blinds should be eliminated in the rooms where the person spends the most time (bedroom, study, television room). Washable vinyl, roller-type shades are optimal.  6. Remove all non-washable stuffed toys from the bedroom. Wash stuffed toys weekly like sheets and blankets above.  7. Reduce indoor humidity to less than 50%. Inexpensive humidity monitors can be purchased at most hardware stores. Do not use a humidifier as can make the problem worse and are not recommended.  Reducing Pollen Exposure The American Academy of Allergy, Asthma and Immunology suggests the following steps to reduce your exposure to pollen during allergy seasons. Do not hang sheets or clothing out to dry; pollen may collect on these items. Do not mow lawns or spend time around freshly cut grass; mowing stirs up pollen. Keep windows closed at night.  Keep car windows closed while driving. Minimize morning activities outdoors, a time when pollen counts are  usually at their highest. Stay indoors as much as possible when pollen counts or humidity is high and on windy days when pollen tends to remain in the air longer. Use air conditioning when possible.  Many air conditioners have filters that trap the pollen spores. Use a HEPA room air filter to remove pollen form the indoor air you breathe.  Control of Mold Allergen Mold and fungi can grow on a variety of surfaces provided certain temperature and moisture conditions exist.  Outdoor molds grow on plants, decaying vegetation and soil.  The major outdoor mold, Alternaria and Cladosporium, are found in very high numbers during hot and dry conditions.  Generally, a late Summer - Fall peak is seen for common outdoor fungal spores.  Rain will temporarily lower outdoor mold spore count, but counts rise rapidly when the rainy period ends.  The most important indoor molds are Aspergillus and Penicillium.  Dark, humid and poorly ventilated basements are ideal sites for mold growth.  The next most common sites of mold growth are the bathroom and the kitchen.  Outdoor [employer] Use air conditioning and keep windows closed Avoid exposure to decaying vegetation. Avoid leaf raking. Avoid grain handling. Consider wearing a face mask if working in moldy areas.  Indoor Mold Control Maintain humidity below 50%. Clean washable surfaces with 5% bleach solution. Remove sources e.g. Contaminated carpets.  Control of Cockroach Allergen  Cockroach allergen has been identified as an important cause of acute attacks of asthma, especially in urban settings.  There are fifty-five species of cockroach that exist in the [place] , however only three, the [place], [place] and [place] species produce allergen that can affect patients with Asthma.  Allergens can be obtained from fecal particles, egg casings and secretions from cockroaches.    Remove food sources. Reduce access to water. Seal access and entry  points. Spray runways with 0.5-1% Diazinon or Chlorpyrifos Blow boric acid power under stoves and refrigerator. Place bait stations (  hydramethylnon) at feeding sites.  Wanda Craze, FNP [facility] 

## 2023-11-18 ENCOUNTER — Other Ambulatory Visit: Payer: Self-pay | Admitting: Adult Health

## 2023-11-27 ENCOUNTER — Telehealth: Payer: Self-pay

## 2023-11-27 NOTE — Telephone Encounter (Signed)
 Patient LM on RX line that [place] be [place] and would need RX sent to her pharmacy for her OCPs.  I spoke to the patient and she stated she would be out of the country 10 days and would need RX sent to her pharmacy for her OCPs.  Patient was informed I would check with Dr. Dallie for a 1 month RF on her OCPs.  I let her know she may have to pay for RX out of her pocket.  Patient wanted to know if she could check with her insurance company to see if they would cover early refill, advised yes or could check with pharmacy for cost.  Patient advised she was also due for AEX and I would have our appointment schedulers contact her for an appointment.  Last OV 02/21/23.  Patient then stated she changed her mind and did not want early refill.

## 2023-12-19 ENCOUNTER — Other Ambulatory Visit: Payer: Self-pay | Admitting: Adult Health

## 2023-12-24 ENCOUNTER — Other Ambulatory Visit: Payer: Self-pay | Admitting: Radiology

## 2023-12-24 DIAGNOSIS — F3281 Premenstrual dysphoric disorder: Secondary | ICD-10-CM

## 2023-12-24 NOTE — Telephone Encounter (Addendum)
 Med refill request: Robin Hoover   Last AEX: last OV 02/21/23  Next AEX: 01/03/24 Last MMG (if hormonal med) Refill authorized: last rx 07/12/23 #[phone] with 1 refill. Please approve or deny

## 2023-12-31 DIAGNOSIS — F411 Generalized anxiety disorder: Secondary | ICD-10-CM | POA: Diagnosis not present

## 2023-12-31 DIAGNOSIS — F321 Major depressive disorder, single episode, moderate: Secondary | ICD-10-CM | POA: Diagnosis not present

## 2023-12-31 DIAGNOSIS — F902 Attention-deficit hyperactivity disorder, combined type: Secondary | ICD-10-CM | POA: Diagnosis not present

## 2024-01-01 NOTE — Progress Notes (Signed)
 23 y.o. G0P0000 female with PMDD (dx 2018 by psych), ADHD here for annual exam. Single. Dog groomer. PCP: Merna Huxley, NP  Patient's last menstrual period was 12/01/2023 (approximate). Period Duration (Days): 3-5 Period Pattern: Regular Menstrual Flow: Moderate Menstrual Control: Tampon Dysmenorrhea: (!) Moderate Dysmenorrhea Symptoms: Nausea, Diarrhea, Headache  She reports urinary frequency. Drink 72 oz of water a day. Wakes 2-3 times at night. Peed 8x of 12hr flight. Knows kegel is weak. Also has leakage with cough, laugh, sneeze.  She was diagnosed of PMDD in 2018 at age 65 with worsening suicidal ideation and self injures behavior prior to her cycles. Started on COC with improved mood. No SI today. Follows with behavioral health.  Urine sample provided: yes  Abnormal bleeding: none Pelvic discharge or pain: none Breast mass, nipple discharge or skin changes : none  Sexually active: Not currently  Birth control: OCP Last PAP:     Component Value Date/Time   DIAGPAP  10/10/2020 1607    - Negative for intraepithelial lesion or malignancy (NILM)   ADEQPAP  10/10/2020 1607    Satisfactory for evaluation; transformation zone component PRESENT.   Gardasil: complete  Exercising: Yes weight training 2 days a week Smoker: No  Flowsheet Row Office Visit from 01/03/2024 in [facility]  PHQ-2 Total Score 1    Flowsheet Row Office Visit from 01/03/2024 in [facility]  PHQ-9 Total Score 11     GYN HISTORY: No sig hx  OB History  Gravida Para Term Preterm AB Living  0 0 0 0 0 0  SAB IAB Ectopic Multiple Live Births  0 0 0 0 0   Past Medical History:  Diagnosis Date   ADHD (attention deficit hyperactivity disorder)    Anxiety    Carpal tunnel syndrome    Depression    GERD (gastroesophageal reflux disease) 05/20/2013   PMDD (premenstrual dysphoric disorder)    PTSD (post-traumatic stress disorder)     Urticaria    Past Surgical History:  Procedure Laterality Date   COLONOSCOPY     UPPER GI ENDOSCOPY     Current Outpatient Medications on File Prior to Visit  Medication Sig Dispense Refill   albuterol  (VENTOLIN  HFA) 108 (90 Base) MCG/ACT inhaler Inhale 1-2 puffs into the lungs as directed. 6.7 g 2   atomoxetine (STRATTERA) 40 MG capsule Take 25 mg by mouth every morning.     busPIRone (BUSPAR) 10 MG tablet Take 10 mg by mouth as needed.     cetirizine  (ZYRTEC ) 10 MG tablet TAKE 1 TABLET(10 MG) BY MOUTH DAILY 90 tablet 1   omeprazole  (PRILOSEC) 40 MG capsule TAKE 1 CAPSULE(40 MG) BY MOUTH DAILY 30 capsule 0   venlafaxine  XR (EFFEXOR -XR) 150 MG 24 hr capsule Take 1 capsule (150 mg total) by mouth every morning. 2 capsule 0   No current facility-administered medications on file prior to visit.   Social History   Socioeconomic History   Marital status: Single    Spouse name: Not on file   Number of children: Not on file   Years of education: Not on file   Highest education level: Not on file  Occupational History   Not on file  Tobacco Use   Smoking status: Never   Smokeless tobacco: Never  Vaping Use   Vaping status: Never Used  Substance and Sexual Activity   Alcohol use: Yes    Alcohol/week: 1.0 standard drink of alcohol    Types:  1 Shots of liquor per week   Drug use: Not Currently   Sexual activity: Not Currently    Birth control/protection: Abstinence  Other Topics Concern   Not on file  Social History Narrative   She likes to sleep, watch tv   Social Drivers of Health   Financial Resource Strain: Not on file  Food Insecurity: Food Insecurity Present (10/10/2020)   Hunger Vital Sign    Worried About Running Out of Food in the Last Year: Sometimes true    Ran Out of Food in the Last Year: Sometimes true  Transportation Needs: No Transportation Needs (10/10/2020)   PRAPARE - Administrator, Civil Service (Medical): No    Lack of Transportation  (Non-Medical): No  Physical Activity: Not on file  Stress: Not on file  Social Connections: Not on file  Intimate Partner Violence: Not on file   Family History  Problem Relation Age of Onset   Urticaria Mother    Eczema Mother    Diabetes Mother    Bipolar disorder Father    Narcolepsy Father    Asthma Sister    Bipolar disorder Sister    Narcolepsy Sister    Bipolar disorder Paternal Aunt    Breast cancer Neg Hx    Ovarian cancer Neg Hx    Allergies  Allergen Reactions   Pollen Extract Anaphylaxis   Dust Mite Mixed Allergen Ext [Mite (D. Farinae)]    Molds & Smuts     PE Today's Vitals   01/03/24 1115  BP: 104/64  Pulse: (!) 126  Temp: 98 F (36.7 C)  TempSrc: Oral  SpO2: 99%  Weight: 234 lb (106.1 kg)  Height: 5' 7 (1.702 m)   Body mass index is 36.65 kg/m.  Physical Exam Vitals reviewed. Exam conducted with a chaperone present.  Constitutional:      General: She is not in acute distress.    Appearance: Normal appearance.  HENT:     Head: Normocephalic and atraumatic.     Nose: Nose normal.  Eyes:     Extraocular Movements: Extraocular movements intact.     Conjunctiva/sclera: Conjunctivae normal.  Neck:     Thyroid : No thyroid  mass, thyromegaly or thyroid  tenderness.  Pulmonary:     Effort: Pulmonary effort is normal.  Abdominal:     General: There is no distension.     Palpations: Abdomen is soft.     Tenderness: There is no abdominal tenderness.  Genitourinary:    General: Normal vulva.     Exam position: Lithotomy position.     Urethra: No prolapse.     Vagina: Normal. No vaginal discharge or bleeding.     Cervix: Normal. No lesion.     Uterus: Normal. Not enlarged and not tender.      Adnexa: Right adnexa normal and left adnexa normal.     Comments: Kegel 0/5 Musculoskeletal:        General: Normal range of motion.     Cervical back: Normal range of motion.  Lymphadenopathy:     Lower Body: No right inguinal adenopathy. No left  inguinal adenopathy.  Skin:    General: Skin is warm and dry.  Neurological:     General: No focal deficit present.     Mental Status: She is alert.  Psychiatric:        Mood and Affect: Mood normal.        Behavior: Behavior normal.       Assessment and Plan:  Well woman exam with routine gynecological exam Assessment & Plan: Cervical cancer screening performed according to ASCCP guidelines.  Labs and immunizations with her primary Encouraged safe sexual practices as indicated Encouraged healthy lifestyle practices with diet and exercise For patients under 50yo, I recommend 1000mg  calcium daily and 600IU of vitamin D  daily.    PMDD (premenstrual dysphoric disorder) Assessment & Plan: Continue COC and care with BH/psych  Orders: -     Drospirenone -Ethinyl Estradiol ; Take 1 tablet by mouth daily.  Dispense: 84 tablet; Refill: 1  Oral contraceptive pill surveillance  Cervical cancer screening -     Cytology - PAP  Pelvic floor dysfunction in female Assessment & Plan: Kegel 0/5 with urinary symptoms Recommend PFPT, patient in agreement  Orders: -     Ambulatory referral to Physical Therapy  Mixed stress and urge urinary incontinence Assessment & Plan: Patient with OAB symptoms. Education on cause provided. Discussed lifestyle modifications for management, including avoidance of fluid prior to bed and bladder irritants. Also discussed PFPT. Consider medications is symptoms unimproved with PT   Orders: -     Ambulatory referral to Physical Therapy  Screening for depression   Vera LULLA Pa, MD

## 2024-01-03 ENCOUNTER — Encounter: Payer: Self-pay | Admitting: Obstetrics and Gynecology

## 2024-01-03 ENCOUNTER — Ambulatory Visit (INDEPENDENT_AMBULATORY_CARE_PROVIDER_SITE_OTHER): Admitting: Obstetrics and Gynecology

## 2024-01-03 ENCOUNTER — Other Ambulatory Visit (HOSPITAL_COMMUNITY)
Admission: RE | Admit: 2024-01-03 | Discharge: 2024-01-03 | Disposition: A | Discharge status: Home / Self Care | Source: Ambulatory Visit | Attending: Obstetrics and Gynecology | Admitting: Obstetrics and Gynecology

## 2024-01-03 VITALS — BP 104/64 | HR 126 | Temp 98.0°F | Ht 67.0 in | Wt 234.0 lb

## 2024-01-03 DIAGNOSIS — N3946 Mixed incontinence: Secondary | ICD-10-CM | POA: Diagnosis not present

## 2024-01-03 DIAGNOSIS — M6289 Other specified disorders of muscle: Secondary | ICD-10-CM | POA: Diagnosis not present

## 2024-01-03 DIAGNOSIS — Z1331 Encounter for screening for depression: Secondary | ICD-10-CM | POA: Diagnosis not present

## 2024-01-03 DIAGNOSIS — F3281 Premenstrual dysphoric disorder: Secondary | ICD-10-CM

## 2024-01-03 DIAGNOSIS — Z124 Encounter for screening for malignant neoplasm of cervix: Secondary | ICD-10-CM | POA: Diagnosis not present

## 2024-01-03 DIAGNOSIS — Z3041 Encounter for surveillance of contraceptive pills: Secondary | ICD-10-CM | POA: Diagnosis not present

## 2024-01-03 DIAGNOSIS — Z01419 Encounter for gynecological examination (general) (routine) without abnormal findings: Secondary | ICD-10-CM | POA: Diagnosis not present

## 2024-01-03 MED ORDER — DROSPIRENONE-ETHINYL ESTRADIOL 3-0.02 MG PO TABS: 1.0000 | ORAL_TABLET | Freq: Every day | ORAL | 1 refills | Status: AC

## 2024-01-03 NOTE — Assessment & Plan Note (Signed)
 Cervical cancer screening performed according to ASCCP guidelines. Labs and immunizations with her primary Encouraged safe sexual practices as indicated Encouraged healthy lifestyle practices with diet and exercise For patients under 24yo, I recommend 1000mg  calcium daily and 600IU of vitamin D daily.

## 2024-01-03 NOTE — Patient Instructions (Signed)

## 2024-01-03 NOTE — Assessment & Plan Note (Signed)
 Patient with OAB symptoms. Education on cause provided. Discussed lifestyle modifications for management, including avoidance of fluid prior to bed and bladder irritants. Also discussed PFPT. Consider medications is symptoms unimproved with PT

## 2024-01-03 NOTE — Assessment & Plan Note (Signed)
 Kegel 0/5 with urinary symptoms Recommend PFPT, patient in agreement

## 2024-01-03 NOTE — Assessment & Plan Note (Signed)
 Continue COC and care with BH/psych

## 2024-01-09 ENCOUNTER — Ambulatory Visit: Payer: Self-pay | Admitting: Obstetrics and Gynecology

## 2024-01-09 LAB — CYTOLOGY - PAP

## 2024-02-10 ENCOUNTER — Other Ambulatory Visit: Payer: Self-pay | Admitting: Adult Health

## 2024-02-25 DIAGNOSIS — F321 Major depressive disorder, single episode, moderate: Secondary | ICD-10-CM | POA: Diagnosis not present

## 2024-02-25 DIAGNOSIS — F411 Generalized anxiety disorder: Secondary | ICD-10-CM | POA: Diagnosis not present

## 2024-02-25 DIAGNOSIS — F902 Attention-deficit hyperactivity disorder, combined type: Secondary | ICD-10-CM | POA: Diagnosis not present

## 2024-02-27 ENCOUNTER — Ambulatory Visit: Payer: Self-pay | Admitting: Adult Health

## 2024-02-27 ENCOUNTER — Ambulatory Visit (INDEPENDENT_AMBULATORY_CARE_PROVIDER_SITE_OTHER): Admitting: Adult Health

## 2024-02-27 VITALS — BP 108/70 | HR 83 | Temp 98.4°F | Ht 67.0 in | Wt 234.0 lb

## 2024-02-27 DIAGNOSIS — F32A Depression, unspecified: Secondary | ICD-10-CM | POA: Diagnosis not present

## 2024-02-27 DIAGNOSIS — F902 Attention-deficit hyperactivity disorder, combined type: Secondary | ICD-10-CM | POA: Diagnosis not present

## 2024-02-27 DIAGNOSIS — Z0000 Encounter for general adult medical examination without abnormal findings: Secondary | ICD-10-CM

## 2024-02-27 DIAGNOSIS — G4733 Obstructive sleep apnea (adult) (pediatric): Secondary | ICD-10-CM

## 2024-02-27 DIAGNOSIS — F419 Anxiety disorder, unspecified: Secondary | ICD-10-CM | POA: Diagnosis not present

## 2024-02-27 DIAGNOSIS — K219 Gastro-esophageal reflux disease without esophagitis: Secondary | ICD-10-CM

## 2024-02-27 DIAGNOSIS — E66812 Obesity, class 2: Secondary | ICD-10-CM

## 2024-02-27 LAB — COMPREHENSIVE METABOLIC PANEL WITH GFR
ALT: 18 U/L (ref 0–35)
AST: 22 U/L (ref 0–37)
Albumin: 3.7 g/dL (ref 3.5–5.2)
Alkaline Phosphatase: 77 U/L (ref 39–117)
BUN: 10 mg/dL (ref 6–23)
CO2: 27 meq/L (ref 19–32)
Calcium: 8.8 mg/dL (ref 8.4–10.5)
Chloride: 103 meq/L (ref 96–112)
Creatinine, Ser: 0.63 mg/dL (ref 0.40–1.20)
GFR: 123.86 mL/min (ref >60.00)
Glucose, Bld: 89 mg/dL (ref 70–99)
Potassium: 4.1 meq/L (ref 3.5–5.1)
Sodium: 139 meq/L (ref 135–145)
Total Bilirubin: 0.3 mg/dL (ref 0.2–1.2)
Total Protein: 7.3 g/dL (ref 6.0–8.3)

## 2024-02-27 LAB — CBC WITH DIFFERENTIAL/PLATELET
Basophils Absolute: 0.1 K/uL (ref 0.0–0.1)
Basophils Relative: 0.7 % (ref 0.0–3.0)
Eosinophils Absolute: 0.2 K/uL (ref 0.0–0.7)
Eosinophils Relative: 2 % (ref 0.0–5.0)
HCT: 40.5 % (ref 36.0–46.0)
Hemoglobin: 13.4 g/dL (ref 12.0–15.0)
Lymphocytes Relative: 36.6 % (ref 12.0–46.0)
Lymphs Abs: 3 K/uL (ref 0.7–4.0)
MCHC: 33 g/dL (ref 30.0–36.0)
MCV: 79.7 fl (ref 78.0–100.0)
Monocytes Absolute: 0.6 K/uL (ref 0.1–1.0)
Monocytes Relative: 6.8 % (ref 3.0–12.0)
Neutro Abs: 4.4 K/uL (ref 1.4–7.7)
Neutrophils Relative %: 53.9 % (ref 43.0–77.0)
Platelets: 380 K/uL (ref 150.0–400.0)
RBC: 5.08 Mil/uL (ref 3.87–5.11)
RDW: 14 % (ref 11.5–15.5)
WBC: 8.1 K/uL (ref 4.0–10.5)

## 2024-02-27 LAB — TSH: TSH: 5.11 u[IU]/mL (ref 0.35–5.50)

## 2024-02-27 LAB — LIPID PANEL
Cholesterol: 164 mg/dL (ref 0–200)
HDL: 75 mg/dL (ref >39.00)
LDL Cholesterol: 54 mg/dL (ref 0–99)
NonHDL: 88.92
Total CHOL/HDL Ratio: 2
Triglycerides: 175 mg/dL — ABNORMAL HIGH (ref 0.0–149.0)
VLDL: 35 mg/dL (ref 0.0–40.0)

## 2024-02-27 LAB — HEMOGLOBIN A1C: Hgb A1c MFr Bld: 6 % (ref 4.6–6.5)

## 2024-02-27 MED ORDER — OMEPRAZOLE 40 MG PO CPDR: DELAYED_RELEASE_CAPSULE | ORAL | 3 refills | Status: AC

## 2024-02-27 NOTE — Therapy (Signed)
 OUTPATIENT PHYSICAL THERAPY FEMALE PELVIC EVALUATION   Patient Name: Robin Hoover MRN: [id number] DOB:January 15, 2001, 23 y.o., female Today's Date: 02/28/2024  END OF SESSION:  PT End of Session - 02/28/24 0931     Visit Number 1    Date for Recertification  08/27/24    Authorization Type [id number]  no auth req  medical necessity    PT Start Time 0845    PT Stop Time 0930    PT Time Calculation (min) 45 min    Activity Tolerance Patient tolerated treatment well    Behavior During Therapy [facility] for tasks assessed/performed          Past Medical History:  Diagnosis Date   ADHD (attention deficit hyperactivity disorder)    Anxiety    Carpal tunnel syndrome    Depression    GERD (gastroesophageal reflux disease) 05/20/2013   PMDD (premenstrual dysphoric disorder)    PTSD (post-traumatic stress disorder)    Urticaria    Past Surgical History:  Procedure Laterality Date   COLONOSCOPY     UPPER GI ENDOSCOPY     Patient Active Problem List   Diagnosis Date Noted   Well woman exam with routine gynecological exam 01/03/2024   Pelvic floor dysfunction in female 01/03/2024   Mixed stress and urge urinary incontinence 01/03/2024   Hives 05/08/2023   PMDD (premenstrual dysphoric disorder) 02/21/2023   Self-injurious behavior 02/21/2023   Suicidal ideation 02/21/2023   Visit for routine gyn exam 10/10/2020   Screening for STD (sexually transmitted disease) 10/10/2020   DUB (dysfunctional uterine bleeding) 10/10/2020   Migraines 01/28/2019   MDD (major depressive disorder), recurrent episode, severe (HCC) 05/20/2013   Generalized anxiety disorder 05/12/2013   ADHD (attention deficit hyperactivity disorder), combined type 03/26/2013   GERD (gastroesophageal reflux disease) 03/26/2013   Mild intermittent asthma 03/26/2013    PCP: Merna Huxley, NP  REFERRING PROVIDER: Dallie Vera GAILS, MD  REFERRING DIAG: [phone] (ICD-10-CM) - Pelvic floor dysfunction in  female N39.46 (ICD-10-CM) - Mixed stress and urge urinary incontinence  THERAPY DIAG:  Other lack of coordination  Muscle weakness (generalized)  Rationale for Evaluation and Treatment: Rehabilitation  ONSET DATE: 2022  SUBJECTIVE:                                                                                                                                                                                           SUBJECTIVE STATEMENT: Patient reports that she has had urinary incontinence for 3 years at least- with coughing, sneezing, throwing up, laughing. During pelvic exams she could not do a kegel.  Cannot drink too much caffeine, energy drinks, can  do 1-2 cups Drinking alcohol and throwing up causes leaking    Fluid intake: always water, burns if she does not drink water. At least 45 oz  FUNCTIONAL LIMITATIONS:  urinary leaking   PERTINENT HISTORY:  Medications for current condition: no Surgeries: no Other: no Sexual abuse: No  PAIN:  Are you having pain? No  PRECAUTIONS: None  RED FLAGS: None   WEIGHT BEARING RESTRICTIONS: No  FALLS:  Has patient fallen in last 6 months? 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  PLOF: Independent  PATIENT GOALS: not peeing herself anymore   BOWEL MOVEMENT: not if she eats correctly  URINATION: Pain with urination: No Fully empty bladder: Yes: but not all the time                                         Post-void dribble: No but not sure Stream: Strong Urgency: No Frequency:during the day no- but trickier with work- copywriter, advertising                                                        Nocturia: Yes: at least 2x   Leakage: Coughing, Sneezing, and Laughing Pads/briefs: Yes:    INTERCOURSE: no pian, on BC  Ability to have vaginal penetration Yes  Pain with intercourse: none Dryness: No Climax: yes Marinoff Scale: 0/3 Lubricant:  PREGNANCY: no Vaginal  deliveries 0 Tearing No Episiotomy No C-section deliveries 0 Currently pregnant No  Periods- cramps when not on birth control  PROLAPSE: None   OBJECTIVE:  Note: Objective measures were completed at Evaluation unless otherwise noted.  DIAGNOSTIC FINDINGS:  Post-void residual: Voiding Cystourethrogram (VCUG):  Ultrasound:   PATIENT SURVEYS:    PFIQ-7: 33 UIQ-7 33   COGNITION: Overall cognitive status: Within functional limits for tasks assessed     SENSATION: Light touch: Appears intact   FUNCTIONAL TESTS:   Single leg stance: no pelvic sway   Sit-up test: 1/3 with breath holding and abdominal doming Squat: appears within functional limitations  Bed mobility:within functional limitations   GAIT: Assistive device utilized: None Comments: WFL  POSTURE: rounded shoulders, forward head, and increased lumbar lordosis   LUMBARAROM/PROM:   A/PROM A/PROM  Eval (% available)  Flexion 90  Extension 90  Right lateral flexion 90  Left lateral flexion 90  Right rotation 90  Left rotation 90   (Blank rows = not tested)  LOWER EXTREMITY ROM: within functional limitations   LOWER EXTREMITY MMT:5/5  PALPATION:  General:tension and mild tenderness bilateral OI  with internal palpation  Pelvic Alignment: even  Abdominal: no tenderness around bladder   Diastasis: Yes:  1 finger, somewhat difficult to assess due to adipose tissue Distortion: Yes   Breathing: upper chest with rib flare Scar tissue: No               External Perineal Exam: labia minora visible, no adhesions, fair clitoral hood mobility                             Internal Pelvic Floor: tension and gripping bilateral obturators  Patient confirms identification and approves PT  to assess internal pelvic floor and treatment Yes All internal or external pelvic floor assessments and/or treatments are completed with proper hand hygiene and gloves hands. If needed gloves are changed with hand hygiene  during patient care time.  PELVIC MMT:   MMT eval  Vaginal 1/5  Internal Anal Sphincter   External Anal Sphincter   Puborectalis   (Blank rows = not tested)        TONE: high  PROLAPSE: None noticed in hook lying  TODAY'S TREATMENT:                                                                                                                              DATE: 02/28/2024  EVAL  Examination completed, findings reviewed, pt educated on POC, HEP, and female pelvic floor anatomy, reasoning with pelvic floor assessment internally with pt consent. Pt motivated to participate in PT and agreeable to attempt recommendations.     PATIENT EDUCATION:  Education details: Pt was educated on relevant anatomy, exam findings, home exercise program, plan of care, expectations of PT and urge suppression training, down training to start with  Person educated: Patient Education method: Explanation, Demonstration, Tactile cues, Verbal cues, and Handouts Education comprehension: verbalized understanding, returned demonstration, verbal cues required, tactile cues required, and needs further education  HOME EXERCISE PROGRAM: Access Code: [email or web address] URL: [email or web address] Date: 02/28/2024 Prepared by: Cori Romir Klimowicz  Exercises - Seated Cough with Pelvic Floor Contraction and Hand to Mouth  - 1 x daily - 7 x weekly - 2 sets - 10 reps - Supine Butterfly Groin Stretch  - 1 x daily - 7 x weekly - 2 sets - 10 reps - Supine Lower Trunk Rotation  - 1 x daily - 7 x weekly - 2 sets - 10 reps - Diaphragmatic Breathing in Child's Pose with Pelvic Floor Relaxation  - 1 x daily - 7 x weekly - 2 sets - 10 reps - Butterfly Groin Stretch  - 1 x daily - 7 x weekly - 2 sets - 10 reps - Pelvic Floor Lengthening in Hooklying  - 1 x daily - 7 x weekly - 2 sets - 10 reps - Deep Squat with Pelvic Floor Relaxation  - 1 x daily - 7 x weekly - 2 sets - 10 reps - Diaphragmatic Breathing at 90/90 Supported  - 1 x  daily - 7 x weekly - 2 sets - 10 reps  Patient Education - Get To Know Your Pelvic Floor- Female - Urinary Urge Control Techniques - Urinary Incontinence - Lifestyle Changes to Support Urinary and Bladder Health  ASSESSMENT:  CLINICAL IMPRESSION: Patient is a 23 y.o. F who was seen today for physical therapy evaluation and treatment for urinary incontinence. She reported that she has had chronic SUI since about 3 years ago. She said she has a lot of stress and also has difficulty finding bathrooms when she is at work. Gets up at night 2 times at least  to urinate. Exam findings notable for abdominal weakness with rib flare, decreased pelvic floor awareness, weak back muscles, breath holding strategies, deep obturator tightness, pelvic floor muscle tightness and high tone, decreased coordination, contracts when asked to drop. Patient unable to [facility] a pelvic floor contraction, tends to contracts when asking to lengthen, increased tension in pelvic floor. She was educated on bladder irritants, exam findings and to try pelvic floor down training to help lengthen muscles. 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 She will benefit from PT to address deficits.     OBJECTIVE IMPAIRMENTS: decreased ROM, decreased strength, and obesity.   ACTIVITY LIMITATIONS: continence and toileting  PARTICIPATION LIMITATIONS: community activity  PERSONAL FACTORS: Behavior pattern and Time since onset of injury/illness/exacerbation are also affecting patient's functional outcome.   REHAB POTENTIAL: Good  CLINICAL DECISION MAKING: Evolving/moderate complexity  EVALUATION COMPLEXITY: Moderate   GOALS: Goals reviewed with patient? Yes  SHORT TERM GOALS: Target date: 03/27/2024    Patient will report consistency with HEP Baseline: Goal status: INITIAL  2.  Patient will complete a bladder diary Baseline:   Goal status: INITIAL  3.  Patient will teach back urge suppression techniques Baseline:  Goal status: INITIAL  4.  Patient will urinate max 1 time at night Baseline:  Goal status: INITIAL  5.  Patient will be able to engage her abdomen and pelvic floor with exhale with lifting 30 lb kettle bell 20 reps Baseline:  Goal status: INITIAL  6.  Patient will report no leaking of urine with coughing Baseline:  Goal status: INITIAL  LONG TERM GOALS: Target date: 08/27/2024  Patient will be able to engage lower abdominals with exhale with her exercises Baseline:  Goal status: INITIAL  2.  Patient will demonstrate improved AROM/ PROM of pelvic floor Baseline:  Goal status: INITIAL  3.  Patient will follow healthy bladder PT recommendations Baseline:  Goal status: INITIAL  4.  Patient will void every 2 to 3 hours Baseline:  Goal status: INITIAL  5.  Patient will soak 0 pads/ day Baseline:  Goal status: INITIAL    PLAN:  PT FREQUENCY: 1-2x/week  PT DURATION: 6 months  PLANNED INTERVENTIONS: 97110-Therapeutic exercises, 97530- Therapeutic activity, 97112- Neuromuscular re-education, [phone]- Self Care, [id number]- Manual therapy, [phone]- Electrical stimulation (manual), [phone]- Ionotophoresis 4mg /ml Dexamethasone, 79439 (1-2 muscles), 20561 (3+ muscles)- Dry Needling, Patient/Family education, Taping, Joint mobilization, Joint manipulation, Spinal manipulation, Spinal mobilization, Scar mobilization, Cryotherapy, Moist heat, and Biofeedback  PLAN FOR NEXT SESSION: bladder diary, continue down training, reverse clams, diaphragmatic breathing, supine rib flare abdominal exercise, deep core engagement, pelvic floor drop with diaphragmatic breathing, manual to lengthen pelvic floor    Naraya Stoneberg, PT 02/28/2024, 9:32 AM

## 2024-02-27 NOTE — Progress Notes (Signed)
 Subjective:    Patient ID: Robin Hoover, female    DOB: 22-Jan-2001, 23 y.o.   MRN: [id number]  HPI Patient presents for yearly preventative medicine examination. She is a pleasant 23 year old female who  has a past medical history of ADHD (attention deficit hyperactivity disorder), Anxiety, Carpal tunnel syndrome, Depression, GERD (gastroesophageal reflux disease) (05/20/2013), PMDD (premenstrual dysphoric disorder), PTSD (post-traumatic stress disorder), and Urticaria.  ADHD -managed by psychiatry.  Currently prescribed Strattera 60 mg daily.  Depression/Anxiety -managed by psychiatry.  Currently managed with BuSpar 10 mg and 10 mg at night PRN and Effexor  150 mg daily.  GERD-controlled with Prilosec 40 mg  Obesity - She is not exercising currently and does not follow a specific diet  Wt Readings from Last 3 Encounters:  02/27/24 234 lb (106.1 kg)  01/03/24 234 lb (106.1 kg)  10/31/23 228 lb 1.6 oz (103.5 kg)   OSA - sleep study in 2021 showed mild OSA with as AHI of 7.2. she never started her CPAP therapy. She does endorse fatigue   All immunizations and health maintenance protocols were reviewed with the patient and needed orders were placed. Refuses vaccinations today   Appropriate screening laboratory values were ordered for the patient including screening of hyperlipidemia, renal function and hepatic function.  Medication reconciliation,  past medical history, social history, problem list and allergies were reviewed in detail with the patient  Goals were established with regard to weight loss, exercise, and  diet in compliance with medications  Review of Systems  Constitutional:  Positive for fatigue.  HENT: Negative.    Eyes: Negative.   Respiratory: Negative.    Cardiovascular: Negative.   Gastrointestinal: Negative.   Endocrine: Negative.   Genitourinary: Negative.   Musculoskeletal: Negative.   Skin: Negative.   Allergic/Immunologic: Negative.    Neurological: Negative.   Hematological: Negative.   Psychiatric/Behavioral: Negative.     Past Medical History:  Diagnosis Date   ADHD (attention deficit hyperactivity disorder)    Anxiety    Carpal tunnel syndrome    Depression    GERD (gastroesophageal reflux disease) 05/20/2013   PMDD (premenstrual dysphoric disorder)    PTSD (post-traumatic stress disorder)    Urticaria     Social History   Socioeconomic History   Marital status: Single    Spouse name: Not on file   Number of children: Not on file   Years of education: Not on file   Highest education level: GED or equivalent  Occupational History   Not on file  Tobacco Use   Smoking status: Never   Smokeless tobacco: Never  Vaping Use   Vaping status: Never Used  Substance and Sexual Activity   Alcohol use: Yes    Alcohol/week: 1.0 standard drink of alcohol    Types: 1 Shots of liquor per week   Drug use: Not Currently   Sexual activity: Not Currently    Birth control/protection: Abstinence  Other Topics Concern   Not on file  Social History Narrative   She likes to sleep, watch tv   Social Drivers of Health   Financial Resource Strain: Low Risk  (02/27/2024)   Overall Financial Resource Strain (CARDIA)    Difficulty of Paying Living Expenses: Not very hard  Food Insecurity: No Food Insecurity (02/27/2024)   Hunger Vital Sign    Worried About Running Out of Food in the Last Year: Never true    Ran Out of Food in the Last Year: Never true  Transportation Needs:  No Transportation Needs (02/27/2024)   PRAPARE - Administrator, Civil Service (Medical): No    Lack of Transportation (Non-Medical): No  Physical Activity: Insufficiently Active (02/27/2024)   Exercise Vital Sign    Days of Exercise per Week: 1 day    Minutes of Exercise per Session: 10 min  Stress: Stress Concern Present (02/27/2024)   [employer] of Occupational Health - Occupational Stress Questionnaire    Feeling of Stress:  To some extent  Social Connections: Socially Isolated (02/27/2024)   Social Connection and Isolation Panel    Frequency of Communication with Friends and Family: More than three times a week    Frequency of Social Gatherings with Friends and Family: More than three times a week    Attends Religious Services: Never    Database Administrator or Organizations: No    Attends Engineer, Structural: Not on file    Marital Status: Never married  Catering Manager Violence: Not on file    Past Surgical History:  Procedure Laterality Date   COLONOSCOPY     UPPER GI ENDOSCOPY      Family History  Problem Relation Age of Onset   Urticaria Mother    Eczema Mother    Diabetes Mother    Bipolar disorder Father    Narcolepsy Father    Asthma Sister    Bipolar disorder Sister    Narcolepsy Sister    Bipolar disorder Paternal Aunt    Breast cancer Neg Hx    Ovarian cancer Neg Hx     Allergies  Allergen Reactions   Pollen Extract Anaphylaxis   Dust Mite Mixed Allergen Ext [Mite (D. Farinae)]    Molds & Smuts     Current Outpatient Medications on File Prior to Visit  Medication Sig Dispense Refill   albuterol  (VENTOLIN  HFA) 108 (90 Base) MCG/ACT inhaler Inhale 1-2 puffs into the lungs as directed. 6.7 g 2   atomoxetine (STRATTERA) 60 MG capsule Take 60 mg by mouth every morning.     busPIRone (BUSPAR) 10 MG tablet Take 10 mg by mouth as needed.     drospirenone -ethinyl estradiol  (NIKKI ) 3-0.02 MG tablet Take 1 tablet by mouth daily. 84 tablet 1   omeprazole  (PRILOSEC) 40 MG capsule TAKE 1 CAPSULE(40 MG) BY MOUTH DAILY 30 capsule 0   venlafaxine  XR (EFFEXOR -XR) 150 MG 24 hr capsule Take 1 capsule (150 mg total) by mouth every morning. 2 capsule 0   cetirizine  (ZYRTEC ) 10 MG tablet TAKE 1 TABLET(10 MG) BY MOUTH DAILY (Patient not taking: Reported on 02/27/2024) 90 tablet 1   No current facility-administered medications on file prior to visit.    BP 108/70   Pulse 83   Temp 98.4  F (36.9 C) (Oral)   Ht 5' 7 (1.702 m)   Wt 234 lb (106.1 kg)   LMP 01/30/2024   SpO2 98%   BMI 36.65 kg/m       Objective:   Physical Exam Vitals and nursing note reviewed.  Constitutional:      General: She is not in acute distress.    Appearance: Normal appearance. She is obese. She is not ill-appearing.  HENT:     Head: Normocephalic and atraumatic.     Right Ear: Tympanic membrane, ear canal and external ear normal. There is no impacted cerumen.     Left Ear: Tympanic membrane, ear canal and external ear normal. There is no impacted cerumen.     Nose: Nose normal. No  congestion or rhinorrhea.     Mouth/Throat:     Mouth: Mucous membranes are moist.     Pharynx: Oropharynx is clear.  Eyes:     Extraocular Movements: Extraocular movements intact.     Conjunctiva/sclera: Conjunctivae normal.     Pupils: Pupils are equal, round, and reactive to light.  Neck:     Vascular: No carotid bruit.  Cardiovascular:     Rate and Rhythm: Normal rate and regular rhythm.     Pulses: Normal pulses.     Heart sounds: No murmur heard.    No friction rub. No gallop.  Pulmonary:     Effort: Pulmonary effort is normal.     Breath sounds: Normal breath sounds.  Abdominal:     General: Abdomen is flat. Bowel sounds are normal. There is no distension.     Palpations: Abdomen is soft. There is no mass.     Tenderness: There is no abdominal tenderness. There is no guarding or rebound.     Hernia: No hernia is present.  Musculoskeletal:        General: Normal range of motion.     Cervical back: Normal range of motion and neck supple.  Lymphadenopathy:     Cervical: No cervical adenopathy.  Skin:    General: Skin is warm and dry.     Capillary Refill: Capillary refill takes less than 2 seconds.  Neurological:     General: No focal deficit present.     Mental Status: She is alert and oriented to person, place, and time.  Psychiatric:        Mood and Affect: Mood normal.         Behavior: Behavior normal.        Thought Content: Thought content normal.        Judgment: Judgment normal.        Assessment & Plan:  1. Routine general medical examination at a health care facility (Primary) Today patient counseled on age appropriate routine health concerns for screening and prevention, each reviewed and up to date or declined. Immunizations reviewed and up to date or declined. Labs ordered and reviewed. Risk factors for depression reviewed and negative. Hearing function and visual acuity are intact. ADLs screened and addressed as needed. Functional ability and level of safety reviewed and appropriate. Education, counseling and referrals performed based on assessed risks today. Patient provided with a copy of personalized plan for preventive services. - needs weight loss.  - Follow up in one year or sooner if needed  2. ADHD (attention deficit hyperactivity disorder), combined type - Per psychiatry   3. Anxiety and depression - Per psychiatry  - Hemoglobin A1c; Future - CBC with Differential/Platelet; Future - Comprehensive metabolic panel with GFR; Future - Lipid panel; Future - TSH; Future  4. Gastroesophageal reflux disease without esophagitis - Continue with Prilosec  - Hemoglobin A1c; Future - CBC with Differential/Platelet; Future - Comprehensive metabolic panel with GFR; Future - Lipid panel; Future - TSH; Future - omeprazole  (PRILOSEC) 40 MG capsule; TAKE 1 CAPSULE(40 MG) BY MOUTH DAILY  Dispense: 90 capsule; Refill: 3  5. Class 2 obesity - Encouraged lifestyle modifications  - Hemoglobin A1c; Future - CBC with Differential/Platelet; Future - Comprehensive metabolic panel with GFR; Future - Lipid panel; Future - TSH; Future  6. OSA (obstructive sleep apnea) - I do think she would benefit from CPAP but she refuses  Darleene Shape, NP

## 2024-02-28 ENCOUNTER — Other Ambulatory Visit: Payer: Self-pay

## 2024-02-28 ENCOUNTER — Ambulatory Visit: Attending: Obstetrics and Gynecology | Admitting: Physical Therapy

## 2024-02-28 ENCOUNTER — Encounter: Payer: Self-pay | Admitting: Physical Therapy

## 2024-02-28 DIAGNOSIS — M6281 Muscle weakness (generalized): Secondary | ICD-10-CM | POA: Insufficient documentation

## 2024-02-28 DIAGNOSIS — N3946 Mixed incontinence: Secondary | ICD-10-CM | POA: Diagnosis not present

## 2024-02-28 DIAGNOSIS — R278 Other lack of coordination: Secondary | ICD-10-CM | POA: Insufficient documentation

## 2024-02-28 DIAGNOSIS — M6289 Other specified disorders of muscle: Secondary | ICD-10-CM | POA: Insufficient documentation

## 2024-05-01 ENCOUNTER — Ambulatory Visit: Admitting: Physical Therapy

## 2024-05-08 ENCOUNTER — Ambulatory Visit: Admitting: Physical Therapy

## 2024-05-15 ENCOUNTER — Ambulatory Visit: Admitting: Physical Therapy

## 2024-05-22 ENCOUNTER — Ambulatory Visit: Admitting: Physical Therapy
